# Patient Record
Sex: Male | Born: 2000 | Race: Black or African American | Hispanic: No | Marital: Single | Smoking: Never smoker
Health system: Southern US, Community
[De-identification: ages and names within clinical notes are randomized; demographics above are authoritative.]

## PROBLEM LIST (undated history)

## (undated) DIAGNOSIS — E739 Lactose intolerance, unspecified: Secondary | ICD-10-CM

## (undated) DIAGNOSIS — F900 Attention-deficit hyperactivity disorder, predominantly inattentive type: Secondary | ICD-10-CM

## (undated) DIAGNOSIS — J302 Other seasonal allergic rhinitis: Secondary | ICD-10-CM

## (undated) DIAGNOSIS — K219 Gastro-esophageal reflux disease without esophagitis: Secondary | ICD-10-CM

## (undated) HISTORY — PX: WISDOM TOOTH EXTRACTION: SHX21

## (undated) HISTORY — DX: Other seasonal allergic rhinitis: J30.2

## (undated) HISTORY — PX: MYRINGOTOMY: SUR874

## (undated) HISTORY — DX: Gastro-esophageal reflux disease without esophagitis: K21.9

## (undated) HISTORY — DX: Attention-deficit hyperactivity disorder, predominantly inattentive type: F90.0

## (undated) HISTORY — PX: OTHER SURGICAL HISTORY: SHX169

## (undated) HISTORY — DX: Lactose intolerance, unspecified: E73.9

---
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
## 2020-06-08 ENCOUNTER — Encounter: Payer: Self-pay | Admitting: Family Medicine

## 2020-06-08 ENCOUNTER — Ambulatory Visit: Payer: 59 | Admitting: Family Medicine

## 2020-06-08 ENCOUNTER — Other Ambulatory Visit: Payer: Self-pay

## 2020-06-08 ENCOUNTER — Ambulatory Visit (HOSPITAL_BASED_OUTPATIENT_CLINIC_OR_DEPARTMENT_OTHER)
Admission: RE | Admit: 2020-06-08 | Discharge: 2020-06-08 | Disposition: A | Discharge status: Home / Self Care | Payer: 59 | Source: Ambulatory Visit | Attending: Family Medicine | Admitting: Family Medicine

## 2020-06-08 VITALS — BP 110/68 | HR 69 | Temp 97.0°F | Wt 147.6 lb

## 2020-06-08 DIAGNOSIS — N5089 Other specified disorders of the male genital organs: Secondary | ICD-10-CM | POA: Diagnosis not present

## 2020-06-08 DIAGNOSIS — N50811 Right testicular pain: Secondary | ICD-10-CM

## 2020-06-08 LAB — POCT URINALYSIS DIP (PROADVANTAGE DEVICE)
Bilirubin, UA: NEGATIVE
Blood, UA: NEGATIVE
Glucose, UA: NEGATIVE mg/dL
Ketones, POC UA: NEGATIVE mg/dL
Leukocytes, UA: NEGATIVE
Nitrite, UA: NEGATIVE
Protein Ur, POC: NEGATIVE mg/dL
Specific Gravity, Urine: 1.025
Urobilinogen, Ur: NEGATIVE
pH, UA: 6 (ref 5.0–8.0)

## 2020-06-08 NOTE — Progress Notes (Signed)
   Subjective:    Patient ID: Frank Lambert, male    DOB: Dec 22, 2000, 20 y.o.   MRN: [id number]  HPI Chief Complaint  Patient presents with  . concern with testicle    Concern with right testicle, uncomfortable, feeling heavy or swelling. Noticied the last 3 days, Had an issue when younger.   Complains of right testicle pain for the past 3 days. Feels like a heaviness. States it looks different than usual, larger and not in same space as usual.  Denies hx of testicle issues with right testicle but did have a varicocele in left in 2014.   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 Does not recall a specific injury in the past week.   Last sexual encounter 2 weeks ago. Same sexual partner for 5 years.   States he masturbated and was able to ejaculate without pain or issues.   Denies fever, chills, abdominal pain, back pain, N/V/D, urinary symptoms or penile discharge.   Reviewed allergies, medications, past medical, surgical, family, and social history.    Review of Systems Pertinent positives and negatives in the history of present illness.     Objective:   Physical Exam Exam conducted with a chaperone present.  Constitutional:      General: He is not in acute distress.    Appearance: Normal appearance. He is not ill-appearing.  Pulmonary:     Effort: Pulmonary effort is normal.  Abdominal:     General: Abdomen is flat. There is no distension.     Palpations: Abdomen is soft.     Tenderness: There is no abdominal tenderness.  Genitourinary:    Penis: Normal. No tenderness, discharge or swelling.      Testes: Cremasteric reflex is present.        Right: Swelling present. Mass, tenderness, testicular hydrocele or varicocele not present.        Left: Mass, tenderness, swelling, testicular hydrocele or varicocele not present.     Epididymis:     Right: Normal.     Left: Normal.  Musculoskeletal:     Cervical back: Normal range of motion.  Skin:     General: Skin is warm and dry.  Neurological:     Mental Status: He is alert.  Psychiatric:        Mood and Affect: Mood normal.        Thought Content: Thought content normal.    BP 110/68   Pulse 69   Temp (!) 97 F (36.1 C)   Wt 147 lb 9.6 oz (67 kg)   BMI 25.34 kg/m       Assessment & Plan:  Pain in right testicle - Plan: POCT Urinalysis DIP (Proadvantage Device), GC/Chlamydia Probe Amp, Trichomonas vaginalis, RNA, [place] Art/Ven Flow Abd Pelv Doppler, US SCROTUM W/DOPPLER, CANCELED: US Scrotum  Swelling of right testicle - Plan: POCT Urinalysis DIP (Proadvantage Device), GC/Chlamydia Probe Amp, Trichomonas vaginalis, RNA, [place] Art/Ven Flow Abd Pelv Doppler, US SCROTUM W/DOPPLER, CANCELED: US Scrotum  UA negative  No acute distress. Gait is abnormal due to pain.  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 No obvious torsion, mass or red flag symptoms. I will screen for STDs and send him for stat US including doppler to assess blood flow.  Follow up pending results or if he is not seeing improvement in the next few days.

## 2020-06-08 NOTE — Progress Notes (Signed)
Spoke with patient via telephone. Will need to send him his urine (STD) results when they are back.

## 2020-06-11 LAB — GC/CHLAMYDIA PROBE AMP
Chlamydia trachomatis, NAA: NEGATIVE
Neisseria Gonorrhoeae by PCR: NEGATIVE

## 2020-06-11 LAB — TRICHOMONAS VAGINALIS, PROBE AMP: Trich vag by NAA: NEGATIVE

## 2021-10-19 ENCOUNTER — Ambulatory Visit: Payer: 59 | Admitting: Medical

## 2021-10-19 VITALS — BP 120/70 | HR 75 | Temp 97.8°F | Wt 152.8 lb

## 2021-10-19 DIAGNOSIS — K219 Gastro-esophageal reflux disease without esophagitis: Secondary | ICD-10-CM | POA: Insufficient documentation

## 2021-10-19 DIAGNOSIS — R195 Other fecal abnormalities: Secondary | ICD-10-CM | POA: Diagnosis not present

## 2021-10-19 DIAGNOSIS — R142 Eructation: Secondary | ICD-10-CM | POA: Insufficient documentation

## 2021-10-19 DIAGNOSIS — R194 Change in bowel habit: Secondary | ICD-10-CM | POA: Diagnosis not present

## 2021-10-19 NOTE — Progress Notes (Signed)
Subjective:  Frank Lambert is a 21 y.o. male who presents for Chief Complaint  Patient presents with   Heartburn    Heartburn, reflux and GI issues- going on since High school. Dairy,spicy foods will cause diarrhea or stomach pain. Declines flu shot      Here for concerns of IBS, heartburn.  Here with mother today  Doesn't do well with dairy, spicy foods.  Yogurt is ok though.  Gets bloaty, loud burps, digestion issues.  Been having problems for a while.  Parents have bad GERD issues as well.   If eating ice cream, uses a lactaid supplement. Cant handle spicy foods.    Currently using some tums, sometimes imodium, sometimes pepto bismol.  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    Has several BMs daily, frequent BMs, loose.  No blood in stool.   No constipation.    Caffeine worsened the loose stools  Denies alcohol or tobacco use  He tries to use diet caution, but does eat a lot of tomatoes.  No other aggravating or relieving factors.    No other c/o.  Past Medical History:  Diagnosis Date   ADHD (attention deficit hyperactivity disorder), inattentive type    per [facility] records   Lactose intolerance    Seasonal allergies    Current Outpatient Medications on File Prior to Visit  Medication Sig Dispense Refill   cholecalciferol (VITAMIN D) 1000 units tablet Take 1,000 Units by mouth daily.     No current facility-administered medications on file prior to visit.   The following portions of the patient's history were reviewed and updated as appropriate: allergies, current medications, past family history, past medical history, past social history, past surgical history and problem list.  ROS Otherwise as in subjective above  Objective: BP 120/70   Pulse 75   Temp 97.8 F (36.6 C)   Wt 152 lb 12.8 oz (69.3 kg)   BMI 26.23 kg/m   General appearance: alert, no distress, well developed, well nourished Oral cavity: MMM, no lesions Abdomen: +bs, soft, non tender, non distended, no masses,  no hepatomegaly, no splenomegaly Pulses: 2+ radial pulses, 2+ pedal pulses, normal cap refill Ext: no edema   Assessment: Encounter Diagnoses  Name Primary?   Loose stools Yes   Frequent bowel movements    Belching    Gastroesophageal reflux disease, unspecified whether esophagitis present      Plan: Counseled on diet, food diary, avoiding food triggers, food discretion, stress reduction where possible.  Handouts given.    Patient Instructions  Keep the following medications on hand for as needed use:  Pepto bismol for upset stomach, stomach cramping or pain or nausea Imodium over the counter up to twice daily for cramping and loose stools Pepcid /Famotidine OTC once daily for acid reflux flare ups Tums as needed for acid reflux/indigestion Consider taking a daily Probiotic such as IB Gard or Align for these concerns  Monitor food diary to avoid foods that make things worse  Avoid eating fast or close to bedtime   If symptoms get much worse, lets refer to gastro specialist   Gastroesophageal Reflux Disease, Adult  Gastroesophageal reflux (GER) happens when acid from the stomach flows up into the tube that connects the mouth and the stomach (esophagus). Normally, food travels down the esophagus and stays in the stomach to be digested. With GER, food and stomach acid sometimes move back up into the esophagus. You may have a disease called gastroesophageal reflux disease (GERD) if the reflux:  Happens often. Causes frequent or very bad symptoms. Causes problems such as damage to the esophagus. When this happens, the esophagus becomes sore and swollen. Over time, GERD can make small holes (ulcers) in the lining of the esophagus. What are the causes? This condition is caused by a problem with the muscle between the esophagus and the stomach. When this muscle is weak or not normal, it does not close properly to keep food and acid from coming back up from the stomach. The  muscle can be weak because of: Tobacco use. Pregnancy. Having a certain type of hernia (hiatal hernia). Alcohol use. Certain foods and drinks, such as coffee, chocolate, onions, and peppermint. What increases the risk? Being overweight. Having a disease that affects your connective tissue. Taking NSAIDs, such a ibuprofen. What are the signs or symptoms? Heartburn. Difficult or painful swallowing. The feeling of having a lump in the throat. A bitter taste in the mouth. Bad breath. Having a lot of saliva. Having an upset or bloated stomach. Burping. Chest pain. Different conditions can cause chest pain. Make sure you see your doctor if you have chest pain. Shortness of breath or wheezing. A long-term cough or a cough at night. Wearing away of the surface of teeth (tooth enamel). Weight loss. How is this treated? Making changes to your diet. Taking medicine. Having surgery. Treatment will depend on how bad your symptoms are. Follow these instructions at home: Eating and drinking  Follow a diet as told by your doctor. You may need to avoid foods and drinks such as: Coffee and tea, with or without caffeine. Drinks that contain alcohol. Energy drinks and sports drinks. Bubbly (carbonated) drinks or sodas. Chocolate and cocoa. Peppermint and mint flavorings. Garlic and onions. Horseradish. Spicy and acidic foods. These include peppers, chili powder, curry powder, vinegar, hot sauces, and BBQ sauce. Citrus fruit juices and citrus fruits, such as oranges, lemons, and limes. Tomato-based foods. These include red sauce, chili, salsa, and pizza with red sauce. Fried and fatty foods. These include donuts, french fries, potato chips, and high-fat dressings. High-fat meats. These include hot dogs, rib eye steak, sausage, ham, and bacon. High-fat dairy items, such as whole milk, butter, and cream cheese. Eat small meals often. Avoid eating large meals. Avoid drinking large amounts of  liquid with your meals. Avoid eating meals during the 2-3 hours before bedtime. Avoid lying down right after you eat. Do not exercise right after you eat. Lifestyle  Do not smoke or use any products that contain nicotine or tobacco. If you need help quitting, ask your doctor. Try to lower your stress. If you need help doing this, ask your doctor. If you are overweight, lose an amount of weight that is healthy for you. Ask your doctor about a safe weight loss goal. General instructions Pay attention to any changes in your symptoms. Take over-the-counter and prescription medicines only as told by your doctor. Do not take aspirin, ibuprofen, or other NSAIDs unless your doctor says it is okay. Wear loose clothes. Do not wear anything tight around your waist. Raise (elevate) the head of your bed about 6 inches (15 cm). You may need to use a wedge to do this. Avoid bending over if this makes your symptoms worse. Keep all follow-up visits. Contact a doctor if: You have new symptoms. You lose weight and you do not know why. You have trouble swallowing or it hurts to swallow. You have wheezing or a cough that keeps happening. You have a hoarse  voice. Your symptoms do not get better with treatment. Get help right away if: You have sudden pain in your arms, neck, jaw, teeth, or back. You suddenly feel sweaty, dizzy, or light-headed. You have chest pain or shortness of breath. You vomit and the vomit is green, yellow, or black, or it looks like blood or coffee grounds. You faint. Your poop (stool) is red, bloody, or black. You cannot swallow, drink, or eat. These symptoms may represent a serious problem that is an emergency. Do not wait to see if the symptoms will go away. Get medical help right away. Call your local emergency services (911 in the [place] Do not drive yourself to the hospital. Summary If a person has gastroesophageal reflux disease (GERD), food and stomach acid move back up into  the esophagus and cause symptoms or problems such as damage to the esophagus. Treatment will depend on how bad your symptoms are. Follow a diet as told by your doctor. Take all medicines only as told by your doctor. This information is not intended to replace advice given to you by your health care provider. Make sure you discuss any questions you have with your health care provider. Document Revised: 07/26/2019 Document Reviewed: 07/26/2019 Elsevier Patient Education  [place].     Irritable Bowel Syndrome, Adult  Irritable bowel syndrome (IBS) is a group of symptoms that affects the organs responsible for digestion (gastrointestinal tract, or GI tract). IBS is not one specific disease. To regulate how the GI tract works, the body sends signals back and forth between the intestines and the brain. If you have IBS, there may be a problem with these signals. As a result, the GI tract does not function normally. The intestines may become more sensitive and overreact to certain things. This may be especially true when you eat certain foods or when you are under stress. There are four main types of IBS. These may be determined based on the consistency of your stool (feces): IBS with mostly (predominance of) diarrhea. IBS with predominance of constipation. IBS with mixed bowel habits. This includes both diarrhea and constipation. IBS unclassified. This includes IBS that cannot be categorized into one of the other three main types. It is important to know which type of IBS you have. Certain treatments are more likely to be helpful for certain types of IBS. What are the causes? The exact cause of IBS is not known. What increases the risk? You may have a higher risk for IBS if you: Are male. Are younger than 40 years. Have a family history of IBS. Have a mental health condition, such as depression, anxiety, or post-traumatic stress disorder. Have had a bacterial infection of your GI  tract. What are the signs or symptoms? Symptoms of IBS vary from person to person. The main symptom is abdominal pain or discomfort. Other symptoms usually include one or more of the following: Diarrhea, constipation, or both. Swelling or bloating in the abdomen. Feeling full after eating a small or regular-sized meal. Frequent gas. Mucus in the stool. A feeling of having more stool left after a bowel movement. Symptoms tend to come and go. They may be triggered by stress, mental health conditions, or certain foods. How is this diagnosed? This condition may be diagnosed based on a physical exam, your medical history, and your symptoms. You may have tests, such as: Blood tests. Stool test. Colonoscopy. This is a procedure in which your GI tract is viewed with a long, thin, flexible tube.  How is this treated? There is no cure for IBS, but treatment can help relieve symptoms. Treatment depends on the type of IBS you have, and may include: Changes to your diet, such as: Avoiding foods that cause symptoms. Drinking more water. Following a low-FODMAP (fermentable oligosaccharides, disaccharides, monosaccharides, and polyols) diet for up to 6 weeks, or as told by your health care provider. FODMAPs are sugars that are hard for some people to digest. Eating more fiber. Eating small meals at the same times every day. Medicines. These may include: Fiber supplements, if you have constipation. Medicine to control diarrhea (antidiarrheal medicines). Medicine to help control muscle tightening (spasms) in your GI tract (antispasmodic medicines). Medicines to help with mental health conditions, such as antidepressants. Talk therapy or counseling. Working with a dietitian to help create a food plan that is right for you. Managing your stress. Follow these instructions at home: Eating and drinking  Eat a healthy diet. Eat 5-6 small meals a day. Try to eat meals at about the same times each day. Do  not eat large meals. Gradually eat more fiber-rich foods. These include whole grains, fruits, and vegetables. This may be especially helpful if you have IBS with constipation. Eat a diet low in FODMAPs. You may need to avoid foods such as citrus fruits, cabbage, garlic, and onions. Drink enough fluid to keep your urine pale yellow. Keep a journal of foods that seem to trigger symptoms. Avoid foods and drinks that: Contain added sugar. Make your symptoms worse. These may include dairy products, caffeinated drinks, and carbonated drinks. Alcohol use Do not drink alcohol if: Your health care provider tells you not to drink. You are pregnant, may be pregnant, or are planning to become pregnant. If you drink alcohol: Limit how much you have to: 0-1 drink a day for women. 0-2 drinks a day for men. Know how much alcohol is in your drink. In the [place] one drink equals one 12 oz bottle of beer (355 mL), one 5 oz glass of wine (148 mL), or one 1 oz glass of hard liquor (44 mL) General instructions Take over-the-counter and prescription medicines only as told by your health care provider. This includes supplements. Get enough exercise. Do at least 150 minutes of moderate-intensity exercise each week. Manage your stress. Getting enough sleep and exercise can help you manage stress. Keep all follow-up visits. This is important. This includes all visits with your health care provider and therapist. Where to find more information International [employer] for Functional Gastrointestinal Disorders: aboutibs.[employer] of Diabetes and Digestive and Kidney Diseases: [employer] Contact a health care provider if: You have constant pain. You lose weight. You have diarrhea that gets worse. You have bleeding from the rectum. You vomit often. You have a fever. Get help right away if: You have severe abdominal pain. You have diarrhea with symptoms of dehydration, such as dizziness or dry  mouth. You have bloody or black stools. You have severe abdominal bloating. You have vomiting that does not stop. You have blood in your vomit. Summary Irritable bowel syndrome (IBS) is not one specific disease. It is a group of symptoms that affects digestion. Your intestines may become more sensitive and overreact to certain things. This may be especially true when you eat certain foods or when you are under stress. There is no cure for IBS, but treatment can help relieve symptoms. This information is not intended to replace advice given to you by your health care provider. Make sure you  discuss any questions you have with your health care provider. Document Revised: 12/27/2020 Document Reviewed: 12/27/2020 Elsevier Patient Education  2023 [place] was seen today for heartburn.  Diagnoses and all orders for this visit:  Loose stools  Frequent bowel movements  Belching  Gastroesophageal reflux disease, unspecified whether esophagitis present    Follow up: prn

## 2021-10-19 NOTE — Patient Instructions (Signed)
Keep the following medications on hand for as needed use:  Pepto bismol for upset stomach, stomach cramping or pain or nausea Imodium over the counter up to twice daily for cramping and loose stools Pepcid /Famotidine OTC once daily for acid reflux flare ups Tums as needed for acid reflux/indigestion Consider taking a daily Probiotic such as IB Gard or Align for these concerns  Monitor food diary to avoid foods that make things worse  Avoid eating fast or close to bedtime   If symptoms get much worse, lets refer to gastro specialist   Gastroesophageal Reflux Disease, Adult  Gastroesophageal reflux (GER) happens when acid from the stomach flows up into the tube that connects the mouth and the stomach (esophagus). Normally, food travels down the esophagus and stays in the stomach to be digested. With GER, food and stomach acid sometimes move back up into the esophagus. You may have a disease called gastroesophageal reflux disease (GERD) if the reflux: Happens often. Causes frequent or very bad symptoms. Causes problems such as damage to the esophagus. When this happens, the esophagus becomes sore and swollen. Over time, GERD can make small holes (ulcers) in the lining of the esophagus. What are the causes? This condition is caused by a problem with the muscle between the esophagus and the stomach. When this muscle is weak or not normal, it does not close properly to keep food and acid from coming back up from the stomach. The muscle can be weak because of: Tobacco use. Pregnancy. Having a certain type of hernia (hiatal hernia). Alcohol use. Certain foods and drinks, such as coffee, chocolate, onions, and peppermint. What increases the risk? Being overweight. Having a disease that affects your connective tissue. Taking NSAIDs, such a ibuprofen. What are the signs or symptoms? Heartburn. Difficult or painful swallowing. The feeling of having a lump in the throat. A bitter taste in  the mouth. Bad breath. Having a lot of saliva. Having an upset or bloated stomach. Burping. Chest pain. Different conditions can cause chest pain. Make sure you see your doctor if you have chest pain. Shortness of breath or wheezing. A long-term cough or a cough at night. Wearing away of the surface of teeth (tooth enamel). Weight loss. How is this treated? Making changes to your diet. Taking medicine. Having surgery. Treatment will depend on how bad your symptoms are. Follow these instructions at home: Eating and drinking  Follow a diet as told by your doctor. You may need to avoid foods and drinks such as: Coffee and tea, with or without caffeine. Drinks that contain alcohol. Energy drinks and sports drinks. Bubbly (carbonated) drinks or sodas. Chocolate and cocoa. Peppermint and mint flavorings. Garlic and onions. Horseradish. Spicy and acidic foods. These include peppers, chili powder, curry powder, vinegar, hot sauces, and BBQ sauce. Citrus fruit juices and citrus fruits, such as oranges, lemons, and limes. Tomato-based foods. These include red sauce, chili, salsa, and pizza with red sauce. Fried and fatty foods. These include donuts, french fries, potato chips, and high-fat dressings. High-fat meats. These include hot dogs, rib eye steak, sausage, ham, and bacon. High-fat dairy items, such as whole milk, butter, and cream cheese. Eat small meals often. Avoid eating large meals. Avoid drinking large amounts of liquid with your meals. Avoid eating meals during the 2-3 hours before bedtime. Avoid lying down right after you eat. Do not exercise right after you eat. Lifestyle  Do not smoke or use any products that contain nicotine or tobacco. If you  need help quitting, ask your doctor. Try to lower your stress. If you need help doing this, ask your doctor. If you are overweight, lose an amount of weight that is healthy for you. Ask your doctor about a safe weight loss  goal. General instructions Pay attention to any changes in your symptoms. Take over-the-counter and prescription medicines only as told by your doctor. Do not take aspirin, ibuprofen, or other NSAIDs unless your doctor says it is okay. Wear loose clothes. Do not wear anything tight around your waist. Raise (elevate) the head of your bed about 6 inches (15 cm). You may need to use a wedge to do this. Avoid bending over if this makes your symptoms worse. Keep all follow-up visits. Contact a doctor if: You have new symptoms. You lose weight and you do not know why. You have trouble swallowing or it hurts to swallow. You have wheezing or a cough that keeps happening. You have a hoarse voice. Your symptoms do not get better with treatment. Get help right away if: You have sudden pain in your arms, neck, jaw, teeth, or back. You suddenly feel sweaty, dizzy, or light-headed. You have chest pain or shortness of breath. You vomit and the vomit is green, yellow, or black, or it looks like blood or coffee grounds. You faint. Your poop (stool) is red, bloody, or black. You cannot swallow, drink, or eat. These symptoms may represent a serious problem that is an emergency. Do not wait to see if the symptoms will go away. Get medical help right away. Call your local emergency services (911 in the [place] Do not drive yourself to the hospital. Summary If a person has gastroesophageal reflux disease (GERD), food and stomach acid move back up into the esophagus and cause symptoms or problems such as damage to the esophagus. Treatment will depend on how bad your symptoms are. Follow a diet as told by your doctor. Take all medicines only as told by your doctor. This information is not intended to replace advice given to you by your health care provider. Make sure you discuss any questions you have with your health care provider. Document Revised: 07/26/2019 Document Reviewed: 07/26/2019 Elsevier Patient  Education  [place].     Irritable Bowel Syndrome, Adult  Irritable bowel syndrome (IBS) is a group of symptoms that affects the organs responsible for digestion (gastrointestinal tract, or GI tract). IBS is not one specific disease. To regulate how the GI tract works, the body sends signals back and forth between the intestines and the brain. If you have IBS, there may be a problem with these signals. As a result, the GI tract does not function normally. The intestines may become more sensitive and overreact to certain things. This may be especially true when you eat certain foods or when you are under stress. There are four main types of IBS. These may be determined based on the consistency of your stool (feces): IBS with mostly (predominance of) diarrhea. IBS with predominance of constipation. IBS with mixed bowel habits. This includes both diarrhea and constipation. IBS unclassified. This includes IBS that cannot be categorized into one of the other three main types. It is important to know which type of IBS you have. Certain treatments are more likely to be helpful for certain types of IBS. What are the causes? The exact cause of IBS is not known. What increases the risk? You may have a higher risk for IBS if you: Are male. Are younger than  40 years. Have a family history of IBS. Have a mental health condition, such as depression, anxiety, or post-traumatic stress disorder. Have had a bacterial infection of your GI tract. What are the signs or symptoms? Symptoms of IBS vary from person to person. The main symptom is abdominal pain or discomfort. Other symptoms usually include one or more of the following: Diarrhea, constipation, or both. Swelling or bloating in the abdomen. Feeling full after eating a small or regular-sized meal. Frequent gas. Mucus in the stool. A feeling of having more stool left after a bowel movement. Symptoms tend to come and go. They may be  triggered by stress, mental health conditions, or certain foods. How is this diagnosed? This condition may be diagnosed based on a physical exam, your medical history, and your symptoms. You may have tests, such as: Blood tests. Stool test. Colonoscopy. This is a procedure in which your GI tract is viewed with a long, thin, flexible tube. How is this treated? There is no cure for IBS, but treatment can help relieve symptoms. Treatment depends on the type of IBS you have, and may include: Changes to your diet, such as: Avoiding foods that cause symptoms. Drinking more water. Following a low-FODMAP (fermentable oligosaccharides, disaccharides, monosaccharides, and polyols) diet for up to 6 weeks, or as told by your health care provider. FODMAPs are sugars that are hard for some people to digest. Eating more fiber. Eating small meals at the same times every day. Medicines. These may include: Fiber supplements, if you have constipation. Medicine to control diarrhea (antidiarrheal medicines). Medicine to help control muscle tightening (spasms) in your GI tract (antispasmodic medicines). Medicines to help with mental health conditions, such as antidepressants. Talk therapy or counseling. Working with a dietitian to help create a food plan that is right for you. Managing your stress. Follow these instructions at home: Eating and drinking  Eat a healthy diet. Eat 5-6 small meals a day. Try to eat meals at about the same times each day. Do not eat large meals. Gradually eat more fiber-rich foods. These include whole grains, fruits, and vegetables. This may be especially helpful if you have IBS with constipation. Eat a diet low in FODMAPs. You may need to avoid foods such as citrus fruits, cabbage, garlic, and onions. Drink enough fluid to keep your urine pale yellow. Keep a journal of foods that seem to trigger symptoms. Avoid foods and drinks that: Contain added sugar. Make your symptoms  worse. These may include dairy products, caffeinated drinks, and carbonated drinks. Alcohol use Do not drink alcohol if: Your health care provider tells you not to drink. You are pregnant, may be pregnant, or are planning to become pregnant. If you drink alcohol: Limit how much you have to: 0-1 drink a day for women. 0-2 drinks a day for men. Know how much alcohol is in your drink. In the [place] one drink equals one 12 oz bottle of beer (355 mL), one 5 oz glass of wine (148 mL), or one 1 oz glass of hard liquor (44 mL) General instructions Take over-the-counter and prescription medicines only as told by your health care provider. This includes supplements. Get enough exercise. Do at least 150 minutes of moderate-intensity exercise each week. Manage your stress. Getting enough sleep and exercise can help you manage stress. Keep all follow-up visits. This is important. This includes all visits with your health care provider and therapist. Where to find more information [employer] for Functional Gastrointestinal Disorders: aboutibs[employer]  of Diabetes and Digestive and Kidney Diseases: [employer] Contact a health care provider if: You have constant pain. You lose weight. You have diarrhea that gets worse. You have bleeding from the rectum. You vomit often. You have a fever. Get help right away if: You have severe abdominal pain. You have diarrhea with symptoms of dehydration, such as dizziness or dry mouth. You have bloody or black stools. You have severe abdominal bloating. You have vomiting that does not stop. You have blood in your vomit. Summary Irritable bowel syndrome (IBS) is not one specific disease. It is a group of symptoms that affects digestion. Your intestines may become more sensitive and overreact to certain things. This may be especially true when you eat certain foods or when you are under stress. There is no cure for IBS, but treatment  can help relieve symptoms. This information is not intended to replace advice given to you by your health care provider. Make sure you discuss any questions you have with your health care provider. Document Revised: 12/27/2020 Document Reviewed: 12/27/2020 Elsevier Patient Education  2023 [employer].

## 2021-10-27 IMAGING — US US SCROTUM W/ DOPPLER COMPLETE
1 series · 14 of 25 positions shown · non-contrast
Comparison: None.

CLINICAL DATA: Right testicular discomfort.

EXAM:
SCROTAL ULTRASOUND
DOPPLER ULTRASOUND OF THE TESTICLES
TECHNIQUE: Complete ultrasound examination of the testicles, epididymis, and
other scrotal structures was performed. Color and spectral Doppler
ultrasound were also utilized to evaluate blood flow to the
testicles.

[Series 1: us scrotum w/doppler · 14 of 47 slices shown]
[im 1/47]
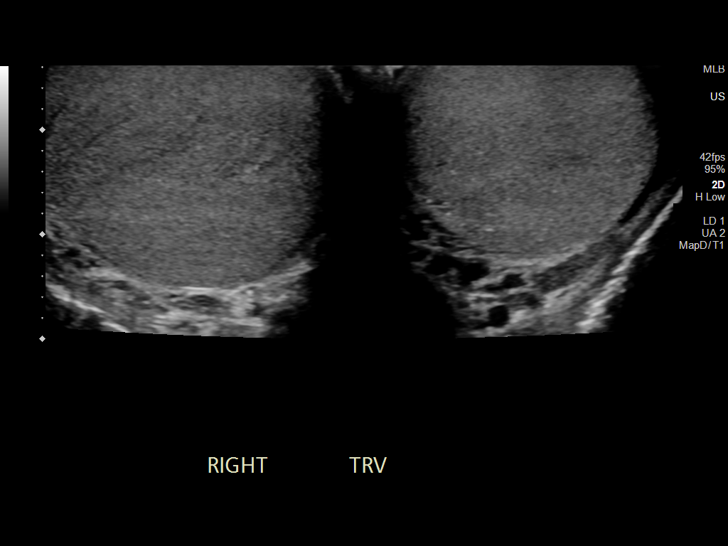
[im 4/47]
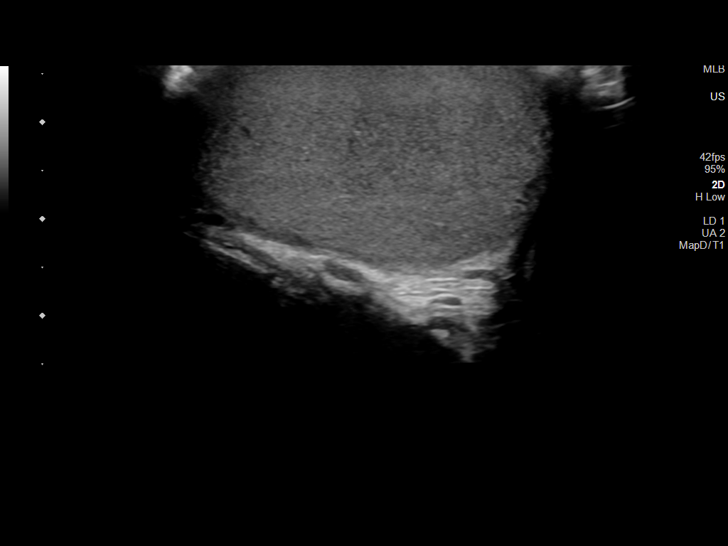
[im 8/47]
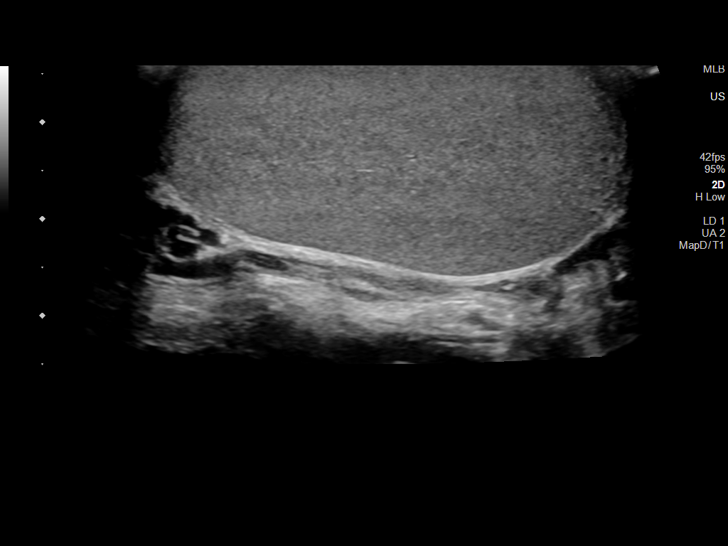
[im 12/47]
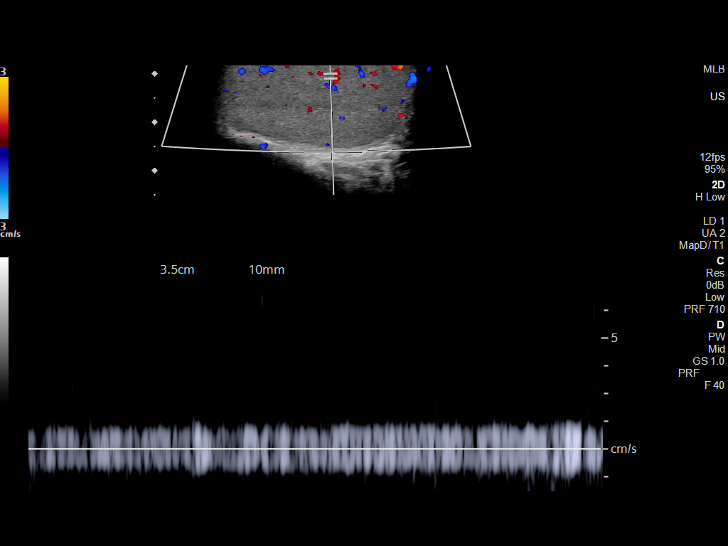
[im 16/47]
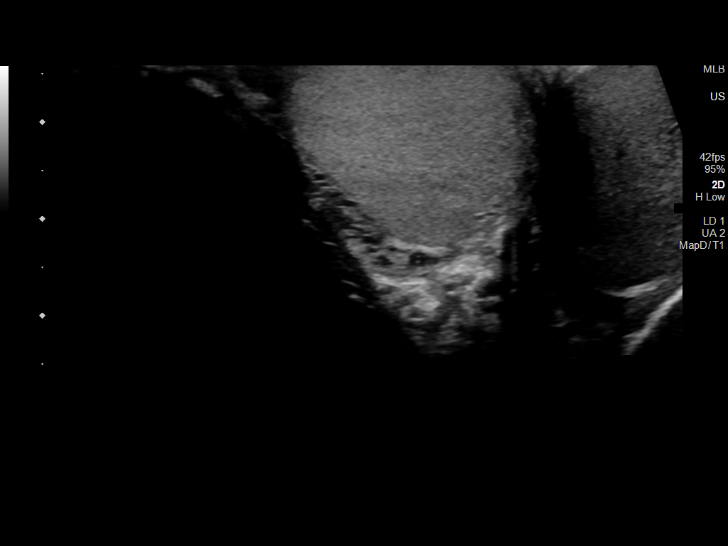
[im 18/47]
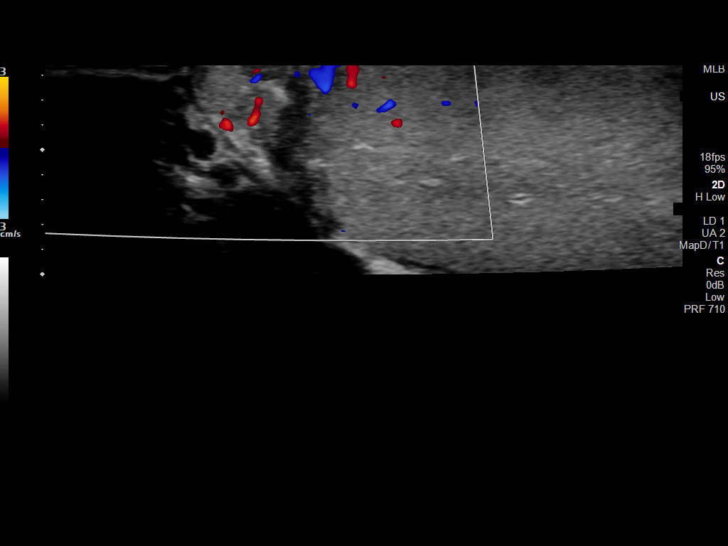
[im 22/47]
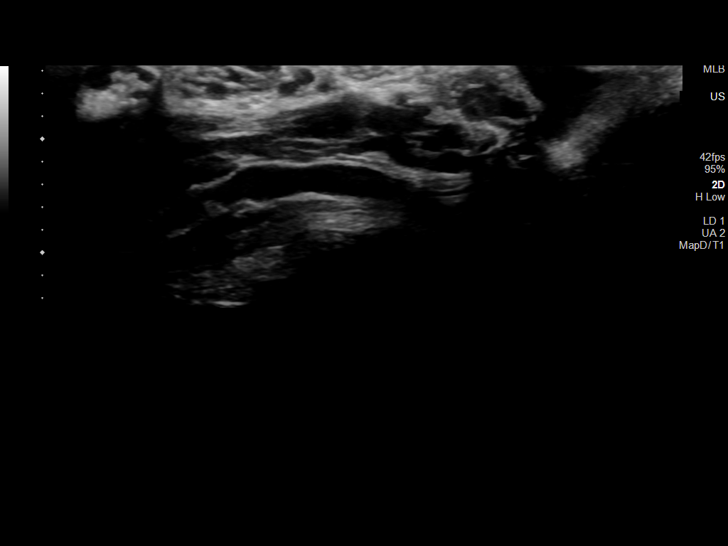
[im 25/47]
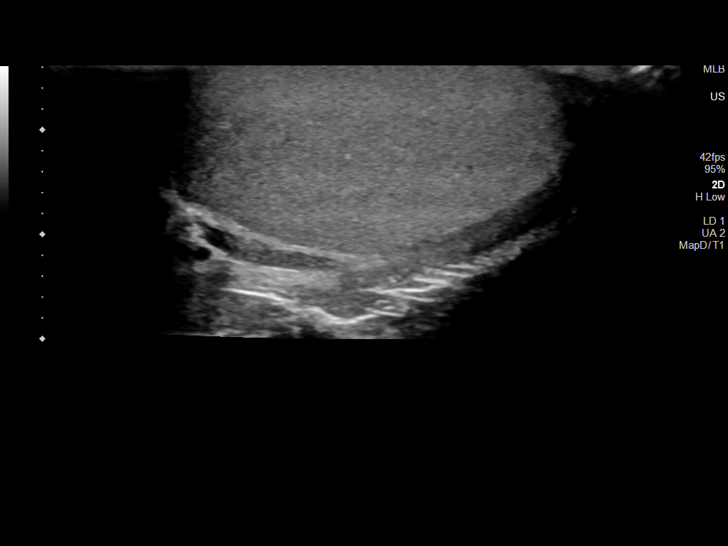
[im 29/47]
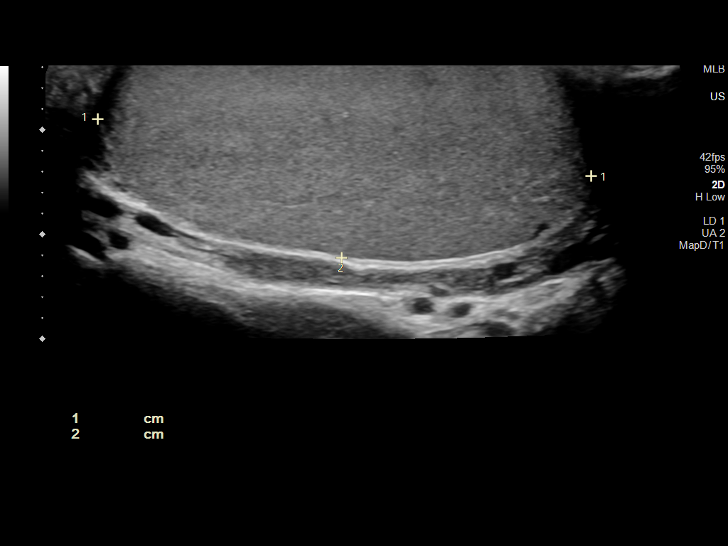
[im 31/47]
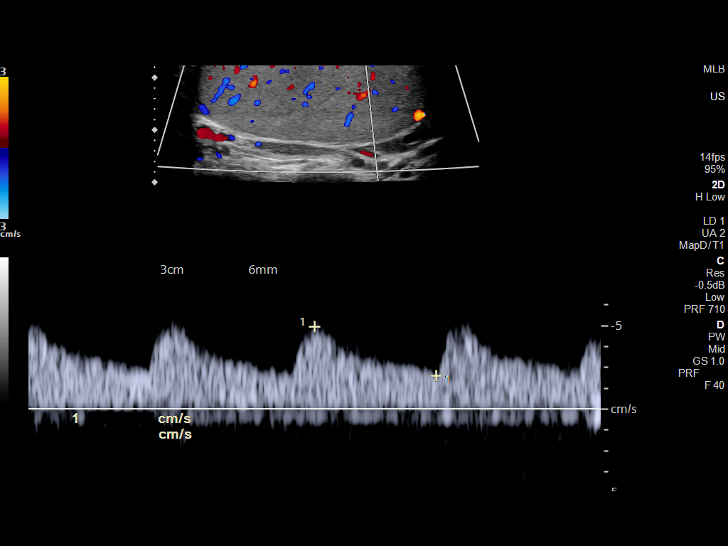
[im 35/47]
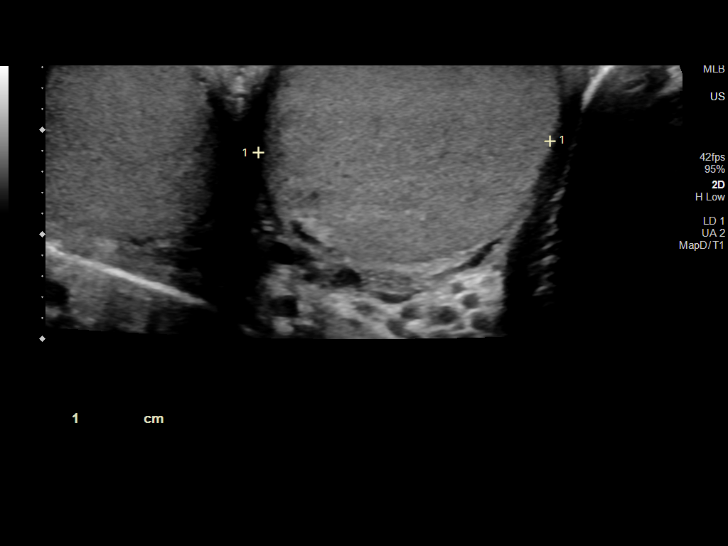
[im 39/47]
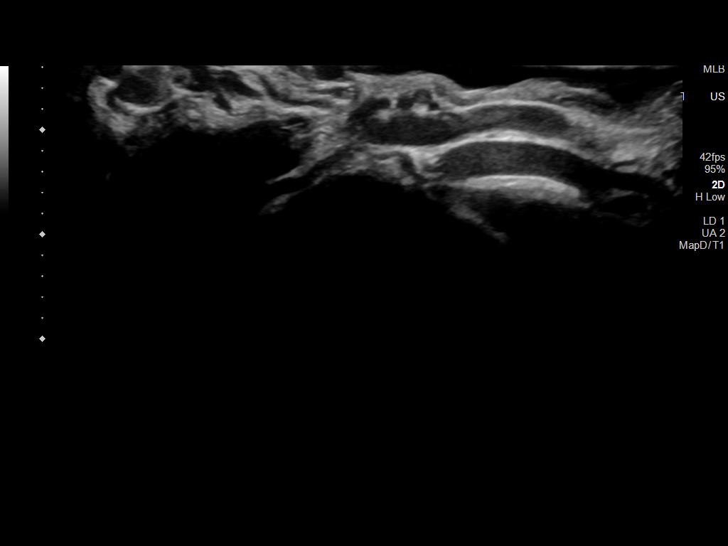
[im 43/47]
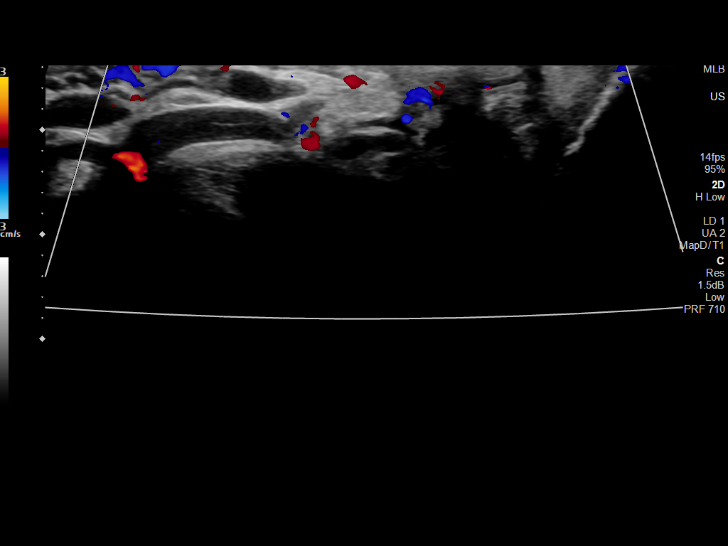
[im 47/47]
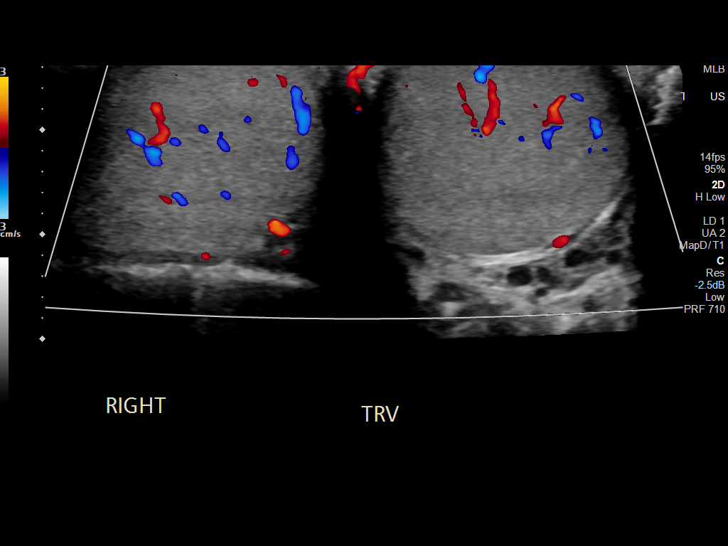

[14 of 25 positions shown; findings below may reference images not displayed]

FINDINGS: Right testicle

Measurements: 4.9 x 2.3 x 3.2 cm. No mass or microlithiasis
visualized.

Left testicle

Measurements: 4.8 x 2.0 x 2.8 cm. No mass or microlithiasis
visualized.

Right epididymis:  Normal in size and appearance.

Left epididymis:  Normal in size and appearance.

Hydrocele:  None visualized.

Varicocele:  Bilateral varicoceles identified.

Pulsed Doppler interrogation of both testes demonstrates normal low
resistance arterial and venous waveforms bilaterally.
IMPRESSION: 1. Bilateral varicoceles.
2. No other abnormalities.

## 2021-12-12 ENCOUNTER — Encounter: Payer: Self-pay | Admitting: Internal Medicine

## 2022-06-26 ENCOUNTER — Ambulatory Visit: Payer: 59 | Admitting: Medical

## 2022-06-26 VITALS — BP 110/70 | HR 82 | Temp 97.0°F | Wt 144.0 lb

## 2022-06-26 DIAGNOSIS — K30 Functional dyspepsia: Secondary | ICD-10-CM

## 2022-06-26 DIAGNOSIS — Z789 Other specified health status: Secondary | ICD-10-CM

## 2022-06-26 DIAGNOSIS — R195 Other fecal abnormalities: Secondary | ICD-10-CM | POA: Diagnosis not present

## 2022-06-26 NOTE — Progress Notes (Signed)
Subjective:  Frank Lambert is a 22 y.o. male who presents for Chief Complaint  Patient presents with   digestion    Digestion- Frank Lambert got bit by mosiqutios, took benadryl, allergic to mosiquitos and will swell up. Had loose stools, feeling off- fever, body aches trip was may 12-16. Took imodium that weekend afterwards, weak, nausated, traveler's diarrhea, some trouble breathing, fever, acid reflux on may 20th. Still having acid reflux. Dengue fever- lost of family members were diagnosed with this from mosquito bites.     Here with mother.  Here for digestion issues, loose stools.     He recently traveled to [place] May 12-16, had numerous mosquito bites while on this trip.   After coming back home started having some loose stools, chills, not feeling well, possible fever.   Used some imodium.  Was having chest pain, abdominal pain, lightheaded.   EMS was called, they could find any significant thing to address, they felt it could be indigestion.   He was not taken to the hospital.  Did have some dry heaving that day EMS was called, as he was trying to make himself vomiting.   That seemed to make tings worse.   He ended up using some Pepcid for about 4 days.  The next day, found out several older people in their group had gotten dengue fever, some ended up in the hospital.  No particular symptoms in the past week.     No other aggravating or relieving factors.    No other c/o.  Past Medical History:  Diagnosis Date   ADHD (attention deficit hyperactivity disorder), inattentive type    per [facility] records   Lactose intolerance    Seasonal allergies    Current Outpatient Medications on File Prior to Visit  Medication Sig Dispense Refill   cholecalciferol (VITAMIN D) 1000 units tablet Take 1,000 Units by mouth daily.     No current facility-administered medications on file prior to visit.    The following portions of the patient's history were reviewed and updated as appropriate:  allergies, current medications, past family history, past medical history, past social history, past surgical history and problem list.  ROS Otherwise as in subjective above  Objective: BP 110/70   Pulse 82   Temp (!) 97 F (36.1 C)   Wt 144 lb (65.3 kg)   BMI 24.72 kg/m    Wt Readings from Last 3 Encounters:  06/26/22 144 lb (65.3 kg)  10/19/21 152 lb 12.8 oz (69.3 kg)  06/08/20 147 lb 9.6 oz (67 kg)    General appearance: alert, no distress, well developed, well nourished Neck: supple, no lymphadenopathy, no thyromegaly, no masses Heart: RRR, normal S1, S2, no murmurs Lungs: CTA bilaterally, no wheezes, rhonchi, or rales Abdomen: +bs, soft, non tender, non distended, no masses, no hepatomegaly, no splenomegaly Pulses: 2+ radial pulses, 2+ pedal pulses, normal cap refill Ext: no edema   Assessment: Encounter Diagnoses  Name Primary?   Loose stools Yes   History of foreign travel    Indigestion      Plan: We discussed his recent symptoms and concerns.  Currently he is asymptomatic for at least a week, exam unremarkable  I suspect some of his recent indigestion and stool changes or in part due to diet.  He was in [place] eating spicy foods and did take some Pepcid which helped.  We discussed that there is the possibility he could have had some kind of viral illness or dengue fever even.  Several people in the group each other with ended up getting dengue fever but they were older and some had to be hospitalized.  He had some symptoms that could be consistent with viral or doing a fever type syndrome however he is resolved fully at this point  Recheck in a few months for a general well visit and labs  Frank Lambert was seen today for digestion.  Diagnoses and all orders for this visit:  Loose stools  History of foreign travel  Indigestion    Follow up: prn

## 2022-08-07 ENCOUNTER — Encounter: Payer: Self-pay | Admitting: Medical

## 2022-08-07 ENCOUNTER — Ambulatory Visit: Payer: 59 | Admitting: Medical

## 2022-08-07 VITALS — BP 110/80 | HR 73 | Ht 65.0 in | Wt 147.6 lb

## 2022-08-07 DIAGNOSIS — K219 Gastro-esophageal reflux disease without esophagitis: Secondary | ICD-10-CM

## 2022-08-07 DIAGNOSIS — Z13220 Encounter for screening for lipoid disorders: Secondary | ICD-10-CM

## 2022-08-07 DIAGNOSIS — Z0000 Encounter for general adult medical examination without abnormal findings: Secondary | ICD-10-CM

## 2022-08-07 DIAGNOSIS — Z23 Encounter for immunization: Secondary | ICD-10-CM

## 2022-08-07 DIAGNOSIS — Z113 Encounter for screening for infections with a predominantly sexual mode of transmission: Secondary | ICD-10-CM

## 2022-08-07 LAB — LIPID PANEL

## 2022-08-07 NOTE — Progress Notes (Signed)
Subjective:   HPI  Frank Lambert is a 22 y.o. male who presents for Chief Complaint  Patient presents with   Annual Exam    Nonfasting cpe, no concerns    Patient Care Team: Steffan Caniglia, Cleda Mccreedy as PCP - General (Family Medicine) Dr. Murlean Iba, GI Mr. Jethro Bolus, eye doctor   Concerns: Has had some issues with GERD, has seen GI recently for consult.  Has PPI but hasn't been using this.   Was using a lot of cheese, but has cut back per GI.   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  Has had some issues with peers, deleted social media due to feeling overwhelmed with some attention from his weight training.   Reviewed their medical, surgical, family, social, medication, and allergy history and updated chart as appropriate.  No Known Allergies  Past Medical History:  Diagnosis Date   ADHD (attention deficit hyperactivity disorder), inattentive type    per [facility] records   GERD (gastroesophageal reflux disease)    Lactose intolerance    Seasonal allergies     Current Outpatient Medications on File Prior to Visit  Medication Sig Dispense Refill   omeprazole (PRILOSEC) 20 MG capsule Take 20 mg by mouth daily. (Patient not taking: Reported on 08/07/2022)     No current facility-administered medications on file prior to visit.     Current Outpatient Medications:    omeprazole (PRILOSEC) 20 MG capsule, Take 20 mg by mouth daily. (Patient not taking: Reported on 08/07/2022), Disp: , Rfl:   Family History  Problem Relation Age of Onset   GER disease Mother    GER disease Father    Cancer Maternal Grandmother        breast   High blood pressure Maternal Grandfather    Cancer Paternal Grandmother        pancreatic   Heart disease Neg Hx    Stroke Neg Hx     Past Surgical History:  Procedure Laterality Date   MYRINGOTOMY     tubes in ears     WISDOM TOOTH EXTRACTION      Review of Systems  Constitutional:  Negative for  chills, fever, malaise/fatigue and weight loss.  HENT:  Negative for congestion, ear pain, hearing loss, sore throat and tinnitus.   Eyes:  Negative for blurred vision, pain and redness.  Respiratory:  Negative for cough, hemoptysis and shortness of breath.   Cardiovascular:  Negative for chest pain, palpitations, orthopnea, claudication and leg swelling.  Gastrointestinal:  Negative for abdominal pain, blood in stool, constipation, diarrhea, nausea and vomiting.  Genitourinary:  Negative for dysuria, flank pain, frequency, hematuria and urgency.  Musculoskeletal:  Negative for falls, joint pain and myalgias.  Skin:  Negative for itching and rash.  Neurological:  Negative for dizziness, tingling, speech change, weakness and headaches.  Endo/Heme/Allergies:  Negative for polydipsia. Does not bruise/bleed easily.  Psychiatric/Behavioral:  Negative for depression and memory loss. The patient is not nervous/anxious and does not have insomnia.       Objective:  BP 110/80   Pulse 73   Ht 5\' 5"  (1.651 m)   Wt 147 lb 9.6 oz (67 kg)   BMI 24.56 kg/m   General appearance: alert, no distress, WD/WN, African American male Skin: unremarkable HEENT: normocephalic, conjunctiva/corneas normal, sclerae anicteric, PERRLA, EOMi, nares patent, no discharge or erythema, pharynx normal Oral cavity: MMM, tongue normal, teeth normal Neck: supple, no lymphadenopathy, no thyromegaly, no masses, normal ROM,  no bruits Chest: non tender, normal shape and expansion Heart: RRR, normal S1, S2, no murmurs Lungs: CTA bilaterally, no wheezes, rhonchi, or rales Abdomen: +bs, soft, non tender, non distended, no masses, no hepatomegaly, no splenomegaly, no bruits Back: non tender, normal ROM, no scoliosis Musculoskeletal: upper extremities non tender, no obvious deformity, normal ROM throughout, lower extremities non tender, no obvious deformity, normal ROM throughout Extremities: no edema, no cyanosis, no  clubbing Pulses: 2+ symmetric, upper and lower extremities, normal cap refill Neurological: alert, oriented x 3, CN2-12 intact, strength normal upper extremities and lower extremities, sensation normal throughout, DTRs 2+ throughout, no cerebellar signs, gait normal Psychiatric: normal affect, behavior normal, pleasant  GU: normal male external genitalia,circumcised, nontender, no masses, no hernia, no lymphadenopathy Rectal: deferred    Assessment and Plan :   Encounter Diagnoses  Name Primary?   Encounter for health maintenance examination in adult Yes   Gastroesophageal reflux disease, unspecified whether esophagitis present    Screening for lipid disorders    Screen for STD (sexually transmitted disease)     This visit was a preventative care visit, also known as wellness visit or routine physical.   Topics typically include healthy lifestyle, diet, exercise, preventative care, vaccinations, sick and well care, proper use of emergency dept and after hours care, as well as other concerns.     Separate significant issues discussed: GERD - seeing GI    General Recommendations: Continue to return yearly for your annual wellness and preventative care visits.  This gives [place] a chance to discuss healthy lifestyle, exercise, vaccinations, review your chart record, and perform screenings where appropriate.  I recommend you see your eye doctor yearly for routine vision care.  I recommend you see your dentist yearly for routine dental care including hygiene visits twice yearly.   Vaccination  Immunization History  Administered Date(s) Administered   DTaP 05/05/2000, 07/07/2000, 09/09/2000, 09/07/2001, 03/20/2004   HIB (PRP-OMP) 05/05/2000, 07/07/2000, 09/09/2000, 03/03/2001   Hepatitis A 03/27/2007, 09/28/2007   Hepatitis B 01/16/01, 04/02/2000, 11/10/2000   Hpv-Unspecified 07/20/2012, 09/25/2012, 01/26/2013   IPV 05/05/2000, 07/07/2000, 09/09/2000, 03/20/2004    Influenza,inj,Quad PF,6+ Mos 09/28/2016, 12/06/2017   Influenza-Unspecified 11/13/2011, 11/23/2012, 11/22/2013   MMR 03/03/2001, 03/20/2004   Meningococcal B, OMV 09/10/2016, 09/19/2017   Meningococcal Conjugate 05/29/2011, 09/10/2016   Moderna Sars-Covid-2 Vaccination 03/25/2019, 04/27/2019   Pneumococcal Conjugate-13 07/07/2000, 10/22/2000, 09/07/2001, 07/20/2012   Tdap 05/29/2011   Varicella 03/03/2001, 03/21/2005     Screening for cancer: Colon cancer screening: Age 30  Testicular cancer screening You should do a monthly self testicular exam if you are between 19-48 years old, and we typically do a testicular exam on the yearly physical for this same age group.   Prostate Cancer screening: The recommended prostate cancer screening test is a blood test called the prostate-specific antigen (PSA) test. PSA is a protein that is made in the prostate. As you age, your prostate naturally produces more PSA. Abnormally high PSA levels may be caused by: Prostate cancer. An enlarged prostate that is not caused by cancer (benign prostatic hyperplasia, or BPH). This condition is very common in older men. A prostate gland infection (prostatitis) or urinary tract infection. Certain medicines such as male hormones (like testosterone) or other medicines that raise testosterone levels. A rectal exam may be done as part of prostate cancer screening to help provide information about the size of your prostate gland. When a rectal exam is performed, it should be done after the PSA level is drawn to  avoid any effect on the results.   Skin cancer screening: Check your skin regularly for new changes, growing lesions, or other lesions of concern Come in for evaluation if you have skin lesions of concern.   Lung cancer screening: If you have a greater than 20 pack year history of tobacco use, then you may qualify for lung cancer screening with a chest CT scan.   Please call your insurance company to  inquire about coverage for this test.   Pancreatic cancer:  no current screening test is available or routinely recommended. (risk factors: smoking, overweight or obese, diabetes, chronic pancreatitis, work exposure - dry cleaning, metal working, 22yo>, M>F, Tree surgeon, family hx/o, hereditary breast, ovarian, melanoma, lynch, peutz-jeghers).  Symptoms: jaundice, dark urine, light color or greasy stools, itchy skin, belly or back pain, weight loss, poor appetite, nause, vomiting, liver enlargement, DVT/blood clots.   We currently don't have screenings for other cancers besides breast, cervical, colon, and lung cancers.  If you have a strong family history of cancer or have other cancer screening concerns, please let me know.  Genetic testing referral is an option for individuals with high cancer risk in the family.  There are some other cancer screenings in development currently.   Bone health: Get at least 150 minutes of aerobic exercise weekly Get weight bearing exercise at least once weekly Bone density test:  A bone density test is an imaging test that uses a type of X-ray to measure the amount of calcium and other minerals in your bones. The test may be used to diagnose or screen you for a condition that causes weak or thin bones (osteoporosis), predict your risk for a broken bone (fracture), or determine how well your osteoporosis treatment is working. The bone density test is recommended for females 65 and older, or females or males <65 if certain risk factors such as thyroid disease, long term use of steroids such as for asthma or rheumatological issues, vitamin D deficiency, estrogen deficiency, family history of osteoporosis, self or family history of fragility fracture in first degree relative.    Heart health: Get at least 150 minutes of aerobic exercise weekly Limit alcohol It is important to maintain a healthy blood pressure and healthy cholesterol numbers  Heart disease  screening: Screening for heart disease includes screening for blood pressure, fasting lipids, glucose/diabetes screening, BMI height to weight ratio, reviewed of smoking status, physical activity, and diet.    Goals include blood pressure 120/80 or less, maintaining a healthy lipid/cholesterol profile, preventing diabetes or keeping diabetes numbers under good control, not smoking or using tobacco products, exercising most days per week or at least 150 minutes per week of exercise, and eating healthy variety of fruits and vegetables, healthy oils, and avoiding unhealthy food choices like fried food, fast food, high sugar and high cholesterol foods.    Other tests may possibly include EKG test, CT coronary calcium score, echocardiogram, exercise treadmill stress test.      Vascular disease screening: For higher risk individuals including smokers, diabetics, patients with known heart disease or high blood pressure, kidney disease, and others, screening for vascular disease or atherosclerosis of the arteries is available.  Examples may include carotid ultrasound, abdominal aortic ultrasound, ABI blood flow screening in the legs, thoracic aorta screening.   Medical care options: I recommend you continue to seek care here first for routine care.  We try really hard to have available appointments Monday through Friday daytime hours for sick visits, acute visits,  and physicals.  Urgent care should be used for after hours and weekends for significant issues that cannot wait till the next day.  The emergency department should be used for significant potentially life-threatening emergencies.  The emergency department is expensive, can often have long wait times for less significant concerns, so try to utilize primary care, urgent care, or telemedicine when possible to avoid unnecessary trips to the emergency department.  Virtual visits and telemedicine have been introduced since the pandemic started in 2020, and  can be convenient ways to receive medical care.  We offer virtual appointments as well to assist you in a variety of options to seek medical care.   Legal  Take the time to do a last will and testament, Advanced Directives including Health Care Power of Attorney and Living Will documents.  Don't leave your family with burdens that can be handled ahead of time.   Advanced Directives: I recommend you consider completing a Health Care Power of Attorney and Living Will.   These documents respect your wishes and help alleviate burdens on your loved ones if you were to become terminally ill or be in a position to need those documents enforced.    You can complete Advanced Directives yourself, have them notarized, then have copies made for our office, for you and for anybody you feel should have them in safe keeping.  Or, you can have an attorney prepare these documents.   If you haven't updated your Last Will and Testament in a while, it may be worthwhile having an attorney prepare these documents together and save on some costs.       Spiritual and Emotional Health Keeping a healthy spiritual life can help you better manage your physical health. Your spiritual life can help you to cope with any issues that may arise with your physical health.  Balance can keep [place] healthy and help [place] to recover.  If you are struggling with your spiritual health there are questions that you may want to ask yourself:  What makes me feel most complete? When do I feel most connected to the rest of the world? Where do I find the most inner strength? What am I doing when I feel whole?  Helpful tips: Being in nature. Some people feel very connected and at peace when they are walking outdoors or are outside. Helping others. Some feel the largest sense of wellbeing when they are of service to others. Being of service can take on many forms. It can be doing volunteer work, being kind to strangers, or offering a hand to a  friend in need. Gratitude. Some people find they feel the most connected when they remain grateful. They may make lists of all the things they are grateful for or say a thank you out loud for all they have.    Emotional Health Are you in tune with your emotional health?  Check out this link: http://www.marquez-love.com/    Financial Health Make sure you use a budget for your personal finances Make sure you are insured against risks (health insurance, life insurance, auto insurance, etc) Save more, spend less Set financial goals If you need help in this area, good resources include counseling through [employer] or other community resources, have a meeting with a Social research officer, government, and a good resource is the [employer]    Demarco was seen today for annual exam.  Diagnoses and all orders for this visit:  Encounter for health maintenance examination in adult -  Comprehensive metabolic panel -     CBC -     Lipid panel -     TSH -     RPR+HIV+GC+CT Panel -     Hepatitis C antibody -     Hepatitis B surface antigen  Gastroesophageal reflux disease, unspecified whether esophagitis present  Screening for lipid disorders -     Lipid panel  Screen for STD (sexually transmitted disease) -     RPR+HIV+GC+CT Panel -     Hepatitis C antibody -     Hepatitis B surface antigen     Follow-up pending labs, yearly for physical

## 2022-08-08 NOTE — Progress Notes (Signed)
Results sent through MyChart

## 2022-08-10 LAB — COMPREHENSIVE METABOLIC PANEL
ALT: 30 IU/L (ref 0–44)
AST: 40 IU/L (ref 0–40)
Albumin: 4.6 g/dL (ref 4.3–5.2)
Alkaline Phosphatase: 94 IU/L (ref 44–121)
BUN/Creatinine Ratio: 18 (ref 9–20)
BUN: 19 mg/dL (ref 6–20)
Bilirubin Total: 0.6 mg/dL (ref 0.0–1.2)
CO2: 22 mmol/L (ref 20–29)
Calcium: 9.7 mg/dL (ref 8.7–10.2)
Chloride: 101 mmol/L (ref 96–106)
Creatinine, Ser: 1.06 mg/dL (ref 0.76–1.27)
Globulin, Total: 2.6 g/dL (ref 1.5–4.5)
Glucose: 80 mg/dL (ref 70–99)
Potassium: 4.4 mmol/L (ref 3.5–5.2)
Sodium: 138 mmol/L (ref 134–144)
Total Protein: 7.2 g/dL (ref 6.0–8.5)
eGFR: 102 mL/min/{1.73_m2} (ref >59)

## 2022-08-10 LAB — LIPID PANEL
Chol/HDL Ratio: 2.5 ratio (ref 0.0–5.0)
Cholesterol, Total: 173 mg/dL (ref 100–199)
HDL: 70 mg/dL (ref >39)
LDL Chol Calc (NIH): 93 mg/dL (ref 0–99)
Triglycerides: 47 mg/dL (ref 0–149)
VLDL Cholesterol Cal: 10 mg/dL (ref 5–40)

## 2022-08-10 LAB — RPR+HIV+GC+CT PANEL
Chlamydia trachomatis, NAA: NEGATIVE
HIV Screen 4th Generation wRfx: NONREACTIVE
Neisseria Gonorrhoeae by PCR: NEGATIVE
RPR Ser Ql: NONREACTIVE

## 2022-08-10 LAB — HEPATITIS C ANTIBODY: Hep C Virus Ab: NONREACTIVE

## 2022-08-10 LAB — CBC
Hematocrit: 43.9 % (ref 37.5–51.0)
Hemoglobin: 13.6 g/dL (ref 13.0–17.7)
MCH: 26.6 pg (ref 26.6–33.0)
MCHC: 31 g/dL — ABNORMAL LOW (ref 31.5–35.7)
MCV: 86 fL (ref 79–97)
Platelets: 271 x10E3/uL (ref 150–450)
RBC: 5.11 x10E6/uL (ref 4.14–5.80)
RDW: 13.1 % (ref 11.6–15.4)
WBC: 5.4 x10E3/uL (ref 3.4–10.8)

## 2022-08-10 LAB — HEPATITIS B SURFACE ANTIGEN: Hepatitis B Surface Ag: NEGATIVE

## 2022-08-10 LAB — TSH: TSH: 0.651 u[IU]/mL (ref 0.450–4.500)

## 2022-08-12 NOTE — Progress Notes (Signed)
 Results sent through MyChart

## 2023-01-15 NOTE — Progress Notes (Signed)
[facility] - Clinic Note  01/16/2023     CHIEF COMPLAINT Patient presents for Retina Evaluation   HISTORY OF PRESENT ILLNESS: Frank Lambert is a 22 y.o. male who presents to the clinic today for:   HPI     Retina Evaluation   In right eye.  Associated Symptoms Negative for Flashes, Floaters, Distortion and Blind Spot.  I, the attending physician,  performed the HPI with the patient and updated documentation appropriately.        Comments   Patient is here today based on a referral from [facility] care for a retinal hole possibly in the right eye. He is using AT's.       Last edited by Rennis Chris, MD on 01/16/2023 11:50 AM.    Patient states the vision has not changed.  Referring physician: [facility] [address] [place],  [place] [address]  HISTORICAL INFORMATION:   Selected notes from the MEDICAL RECORD NUMBER Referred by Dr. Nedra Hai:  Ocular Hx- PMH-    CURRENT MEDICATIONS: Current Outpatient Medications (Ophthalmic Drugs)  Medication Sig   prednisoLONE acetate (PRED FORTE) 1 % ophthalmic suspension Place 1 drop into the right eye 4 (four) times daily for 7 days.   No current facility-administered medications for this visit. (Ophthalmic Drugs)   Current Outpatient Medications (Other)  Medication Sig   omeprazole (PRILOSEC) 20 MG capsule Take 20 mg by mouth daily. (Patient not taking: Reported on 01/16/2023)   No current facility-administered medications for this visit. (Other)      REVIEW OF SYSTEMS: ROS   Positive for: Gastrointestinal, Eyes, Allergic/Imm Last edited by Charlette Caffey, COT on 01/16/2023  8:38 AM.       ALLERGIES No Known Allergies  PAST MEDICAL HISTORY Past Medical History:  Diagnosis Date   ADHD (attention deficit hyperactivity disorder), inattentive type    per [facility] records   GERD (gastroesophageal reflux disease)    Lactose intolerance    Seasonal allergies    Past  Surgical History:  Procedure Laterality Date   MYRINGOTOMY     tubes in ears     WISDOM TOOTH EXTRACTION      FAMILY HISTORY Family History  Problem Relation Age of Onset   GER disease Mother    GER disease Father    Cancer Maternal Grandmother        breast   High blood pressure Maternal Grandfather    Cancer Paternal Grandmother        pancreatic   Heart disease Neg Hx    Stroke Neg Hx     SOCIAL HISTORY Social History   Tobacco Use   Smoking status: Never   Smokeless tobacco: Never   Tobacco comments:    No smokers at home  Vaping Use   Vaping status: Never Used  Substance Use Topics   Alcohol use: Yes    Comment: special occasion   Drug use: No         OPHTHALMIC EXAM:  Base Eye Exam     Visual Acuity (Snellen - Linear)       Right Left   Dist cc 20/20 20/20    Correction: Glasses         Tonometry (Tonopen, 8:44 AM)       Right Left   Pressure 14 13         Pupils       Dark Light Shape React APD   Right 3  2 Round Brisk None   Left 3 2 Round Brisk None         Visual Fields       Left Right    Full Full         Extraocular Movement       Right Left    Full, Ortho Full, Ortho         Neuro/Psych     Oriented x3: Yes         Dilation     Both eyes: 1.0% Mydriacyl, 2.5% Phenylephrine @ 8:39 AM           Slit Lamp and Fundus Exam     External Exam       Right Left   External Normal Normal         Slit Lamp Exam       Right Left   Lids/Lashes Normal Normal   Conjunctiva/Sclera Mild Melanosis Mild Melanosis   Cornea Clear Clear   Anterior Chamber Deep and clear Deep and clear   Iris Round and dilated Round and dilated   Lens Clear Clear   Anterior Vitreous Normal Normal         Fundus Exam       Right Left   Disc Pink and sharp Pink and sharp, mild PPP   C/D Ratio 0.3 0.2   Macula Flat, Good foveal reflex, No heme or edema Flat, Good foveal reflex, No heme or edema   Vessels Normal  Normal   Periphery Attached, mild White without pressure temporally, pigment VR tuft /break 1030, mild peripheral lattice temporally Attached, White without pressure temporally, mild peripheral lattice           Refraction     Wearing Rx       Sphere Cylinder Axis   Right -3.75 +0.50 077   Left -3.00 +0.75 109            IMAGING AND PROCEDURES  Imaging and Procedures for 01/16/2023  OCT, Retina - OU - Both Eyes       Right Eye Quality was good. Central Foveal Thickness: 242. Progression has no prior data. Findings include normal foveal contour, no IRF, no SRF, vitreomacular adhesion .   Left Eye Quality was good. Central Foveal Thickness: 242. Progression has no prior data. Findings include normal foveal contour, no IRF, no SRF, vitreomacular adhesion .   Notes *Images captured and stored on drive  Diagnosis / Impression:  NFP; no IRF/SRF OU  Clinical management:  See below  Abbreviations: NFP - Normal foveal profile. CME - cystoid macular edema. PED - pigment epithelial detachment. IRF - intraretinal fluid. SRF - subretinal fluid. EZ - ellipsoid zone. ERM - epiretinal membrane. ORA - outer retinal atrophy. ORT - outer retinal tubulation. SRHM - subretinal hyper-reflective material. IRHM - intraretinal hyper-reflective material      Repair Retinal Breaks, Laser - OD - Right Eye       Anesthesia Anesthetic medications included Proparacaine 0.5%.   Notes LASER PROCEDURE NOTE  Procedure:  Barrier laser retinopexy using slit lamp laser, RIGHT eye   Diagnosis:   Retinal break and peripheral lattice degeneration, RIGHT eye                     Pigmented retinal break at 1030                        Mild peripheral lattice temporal hemisphere  Surgeon: Arlys John  Vanessa Barbara, MD, PhD  Anesthesia: Topical  Informed consent obtained, operative eye marked, and time out performed prior to initiation of laser.   Laser settings:  Lumenis Smart532 laser, slit lamp Lens:  Mainster PRP 165 Power: 250 mW Spot size: 200 microns Duration: 30 msec  # spots: 448  Placement of laser: Using a Mainster PRP 165 contact lens at the slit lamp, laser was placed in three confluent rows around pigmented retinal break at 1030 and posterior to patches of lattice in temporal periphery.  Complications: None.  Patient tolerated the procedure well and received written and verbal post-procedure care information/education.           ASSESSMENT/PLAN:    ICD-10-CM   1. Retinal break of right eye  H33.301 OCT, Retina - OU - Both Eyes    Repair Retinal Breaks, Laser - OD - Right Eye    2. 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   - The incidence, risk factors, and natural history of retinal tear was discussed with patient.   - Potential treatment options including laser retinopexy and cryotherapy discussed with patient. - pigmented VR tuft /break 1030 - recommend laser retinopexy OD today 12.19.24 - RBA of procedure discussed, questions answered - informed consent obtained and signed - see procedure note - start PF QID x7 days - sent to pharmacy on file - f/u in 3-4 wks, DFE, OCT  2. Lattice degeneration, OU - Mild peripheral lattice in both eyes - discussed findings, prognosis, and treatment options including observation - reviewed sign/symptoms of retinal tear and detachment  - recommend laser retinopexy OD along with treatment for retinal break as above -- see above - will monitor OS for now   Ophthalmic Meds Ordered this visit:  Meds ordered this encounter  Medications   prednisoLONE acetate (PRED FORTE) 1 % ophthalmic suspension    Sig: Place 1 drop into the right eye 4 (four) times daily for 7 days.    Dispense:  1.4 mL    Refill:  0     Return in about 3 weeks (around 02/06/2023) for f/u Retinal hole OD, DFE, OCT.  There are no Patient Instructions on file for this visit.   Explained the diagnoses,  plan, and follow up with the patient and they expressed understanding.  Patient expressed understanding of the importance of proper follow up care.   This document serves as a record of services personally performed by Karie Chimera, MD, PhD. It was created on their behalf by Annalee Genta, COMT. The creation of this record is the provider's dictation and/or activities during the visit.  Electronically signed by: Annalee Genta, COMT 01/16/23 12:07 PM  This document serves as a record of services personally performed by Karie Chimera, MD, PhD. It was created on their behalf by Charlette Caffey, COT an ophthalmic technician. The creation of this record is the provider's dictation and/or activities during the visit.    Electronically signed by:  Charlette Caffey, COT  01/16/23 12:07 PM   Karie Chimera, M.D., Ph.D. Diseases & Surgery of the Retina and Vitreous [facility]  I have reviewed the above documentation for accuracy and completeness, and I agree with the above. Karie Chimera, M.D., Ph.D. 01/16/23 12:07 PM   Abbreviations: M myopia (nearsighted); A astigmatism; H hyperopia (farsighted); P presbyopia; Mrx spectacle prescription;  CTL contact lenses; OD right eye; OS left eye; OU  both eyes  XT exotropia; ET esotropia; PEK punctate epithelial keratitis; PEE punctate epithelial erosions; DES dry eye syndrome; MGD meibomian gland dysfunction; ATs artificial tears; PFAT's preservative free artificial tears; NSC nuclear sclerotic cataract; PSC posterior subcapsular cataract; ERM epi-retinal membrane; PVD posterior vitreous detachment; RD retinal detachment; DM diabetes mellitus; DR diabetic retinopathy; NPDR non-proliferative diabetic retinopathy; PDR proliferative diabetic retinopathy; CSME clinically significant macular edema; DME diabetic macular edema; dbh dot blot hemorrhages; CWS cotton wool spot; POAG primary open angle glaucoma; C/D cup-to-disc ratio; HVF  humphrey visual field; GVF goldmann visual field; OCT optical coherence tomography; IOP intraocular pressure; BRVO Branch retinal vein occlusion; CRVO central retinal vein occlusion; CRAO central retinal artery occlusion; BRAO branch retinal artery occlusion; RT retinal tear; SB scleral buckle; PPV pars plana vitrectomy; VH Vitreous hemorrhage; PRP panretinal laser photocoagulation; IVK intravitreal kenalog; VMT vitreomacular traction; MH Macular hole;  NVD neovascularization of the disc; NVE neovascularization elsewhere; AREDS age related eye disease study; ARMD age related macular degeneration; POAG primary open angle glaucoma; EBMD epithelial/anterior basement membrane dystrophy; ACIOL anterior chamber intraocular lens; IOL intraocular lens; PCIOL posterior chamber intraocular lens; Phaco/IOL phacoemulsification with intraocular lens placement; PRK photorefractive keratectomy; LASIK laser assisted in situ keratomileusis; HTN hypertension; DM diabetes mellitus; COPD chronic obstructive pulmonary disease

## 2023-01-16 ENCOUNTER — Ambulatory Visit (INDEPENDENT_AMBULATORY_CARE_PROVIDER_SITE_OTHER): Payer: 59 | Admitting: Ophthalmology

## 2023-01-16 ENCOUNTER — Encounter (INDEPENDENT_AMBULATORY_CARE_PROVIDER_SITE_OTHER): Payer: Self-pay | Admitting: Ophthalmology

## 2023-01-16 DIAGNOSIS — H35413 Lattice degeneration of retina, bilateral: Secondary | ICD-10-CM

## 2023-01-16 DIAGNOSIS — H33301 Unspecified retinal break, right eye: Secondary | ICD-10-CM | POA: Diagnosis not present

## 2023-01-16 MED ORDER — PREDNISOLONE ACETATE 1 % OP SUSP: 1.0000 [drp] | Freq: Four times a day (QID) | OPHTHALMIC | 0 refills | Status: AC

## 2023-02-06 NOTE — Progress Notes (Signed)
 [facility] - Clinic Note  02/07/2023     CHIEF COMPLAINT Patient presents for Retina Follow Up   HISTORY OF PRESENT ILLNESS: Frank Lambert is a 23 y.o. male who presents to the clinic today for:   HPI     Retina Follow Up   Patient presents with  Other.  I, the attending physician,  performed the HPI with the patient and updated documentation appropriately.        Comments   3 week S/P laser break od. Patient states after laser he had a really bad migraine on the right side. Also Patient states his eyelid on the right side has been twitching       Last edited by Valdemar Rogue, MD on 02/08/2023  2:02 AM.     Patient states that he had a migraine over the right eye after the laser for about 2 hours.  Referring physician: Bulah Alm RAMAN, PA-C [address] [place],  [place] [address]  HISTORICAL INFORMATION:   Selected notes from the MEDICAL RECORD NUMBER Referred by Dr. JAMA:  Ocular Hx- PMH-    CURRENT MEDICATIONS: No current outpatient medications on file. (Ophthalmic Drugs)   No current facility-administered medications for this visit. (Ophthalmic Drugs)   Current Outpatient Medications (Other)  Medication Sig   omeprazole (PRILOSEC) 20 MG capsule Take 20 mg by mouth daily. (Patient not taking: Reported on 02/07/2023)   No current facility-administered medications for this visit. (Other)      REVIEW OF SYSTEMS: ROS   Positive for: Gastrointestinal, Eyes, Allergic/Imm Last edited by German Olam BRAVO, COT on 02/07/2023  7:55 AM.        ALLERGIES No Known Allergies  PAST MEDICAL HISTORY Past Medical History:  Diagnosis Date   ADHD (attention deficit hyperactivity disorder), inattentive type    per [facility] records   GERD (gastroesophageal reflux disease)    Lactose intolerance    Seasonal allergies    Past Surgical History:  Procedure Laterality Date   MYRINGOTOMY     tubes in ears     WISDOM TOOTH EXTRACTION       FAMILY HISTORY Family History  Problem Relation Age of Onset   GER disease Mother    GER disease Father    Cancer Maternal Grandmother        breast   High blood pressure Maternal Grandfather    Cancer Paternal Grandmother        pancreatic   Heart disease Neg Hx    Stroke Neg Hx     SOCIAL HISTORY Social History   Tobacco Use   Smoking status: Never   Smokeless tobacco: Never   Tobacco comments:    No smokers at home  Vaping Use   Vaping status: Never Used  Substance Use Topics   Alcohol use: Yes    Comment: special occasion   Drug use: No         OPHTHALMIC EXAM:  Base Eye Exam     Visual Acuity (Snellen - Linear)       Right Left   Dist cc 20/20 20/20    Correction: Glasses         Tonometry (Tonopen, 8:03 AM)       Right Left   Pressure 13          Pupils       Dark Light Shape React APD   Right 3 2 Round Brisk None   Left 3 2 Round Brisk None  Visual Fields       Left Right    Full Full         Extraocular Movement       Right Left    Full, Ortho Full, Ortho         Neuro/Psych     Oriented x3: Yes   Mood/Affect: Normal         Dilation     Right eye: 1.0% Mydriacyl, 2.5% Phenylephrine @ 8:03 AM           Slit Lamp and Fundus Exam     External Exam       Right Left   External Normal Normal         Slit Lamp Exam       Right Left   Lids/Lashes Normal Normal   Conjunctiva/Sclera Mild Melanosis Mild Melanosis   Cornea Clear Clear   Anterior Chamber Deep and clear Deep and clear   Iris Round and dilated Round and dilated   Lens Clear Clear   Anterior Vitreous Normal Normal         Fundus Exam       Right Left   Disc Pink and sharp Pink and sharp, mild PPP   C/D Ratio 0.3 0.2   Macula Flat, Good foveal reflex, No heme or edema Flat, Good foveal reflex, No heme or edema   Vessels Normal Normal   Periphery Attached, mild White without pressure temporally, pigment VR tuft /break  1030-- good laser surrounding, mild peripheral lattice temporally- - good laser surrounding Attached, White without pressure temporally, mild peripheral lattice           Refraction     Wearing Rx       Sphere Cylinder Axis   Right -3.75 +0.50 077   Left -3.00 +0.75 109            IMAGING AND PROCEDURES  Imaging and Procedures for 02/07/2023  OCT, Retina - OU - Both Eyes       Right Eye Quality was good. Central Foveal Thickness: 243. Progression has been stable. Findings include normal foveal contour, no IRF, no SRF, vitreomacular adhesion .   Left Eye Quality was good. Central Foveal Thickness: 247. Progression has been stable. Findings include normal foveal contour, no IRF, no SRF, vitreomacular adhesion .   Notes *Images captured and stored on drive  Diagnosis / Impression:  NFP; no IRF/SRF OU  Clinical management:  See below  Abbreviations: NFP - Normal foveal profile. CME - cystoid macular edema. PED - pigment epithelial detachment. IRF - intraretinal fluid. SRF - subretinal fluid. EZ - ellipsoid zone. ERM - epiretinal membrane. ORA - outer retinal atrophy. ORT - outer retinal tubulation. SRHM - subretinal hyper-reflective material. IRHM - intraretinal hyper-reflective material             ASSESSMENT/PLAN:    ICD-10-CM   1. Retinal break of right eye  H33.301 OCT, Retina - OU - Both Eyes    2. 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   - pigmented VR tuft /break 1030 - s/p laser retinopexy OD 12.19.24 -- good laser changes in place - completed PF QID x7 days - no new RT/RD - f/u 3 months DFE, OCT, dilate OU  2. Lattice degeneration, OU - Mild peripheral lattice in both eyes - discussed findings, prognosis, and treatment options including observation - reviewed sign/symptoms of retinal tear and detachment  -  s/p laser retinopexy OD 12.19.24 as above - will monitor OS for now   Ophthalmic Meds  Ordered this visit:  No orders of the defined types were placed in this encounter.    Return in about 3 months (around 05/08/2023) for f/u Retinal break OD , DFE, OCT, dilate OU.  There are no Patient Instructions on file for this visit.   This document serves as a record of services personally performed by Redell JUDITHANN Hans, MD, PhD. It was created on their behalf by Delon Newness COT, an ophthalmic technician. The creation of this record is the provider's dictation and/or activities during the visit.    Electronically signed by: Delon Newness COT 01.09.25 2:03 AM  This document serves as a record of services personally performed by Redell JUDITHANN Hans, MD, PhD. It was created on their behalf by Wanda GEANNIE Keens, COT an ophthalmic technician. The creation of this record is the provider's dictation and/or activities during the visit.    Electronically signed by:  Wanda GEANNIE Keens, COT  02/08/23 2:03 AM  Redell JUDITHANN Hans, M.D., Ph.D. Diseases & Surgery of the Retina and Vitreous [facility] 02/07/2023   I have reviewed the above documentation for accuracy and completeness, and I agree with the above. Redell JUDITHANN Hans, M.D., Ph.D. 02/08/23 2:05 AM   Abbreviations: M myopia (nearsighted); A astigmatism; H hyperopia (farsighted); P presbyopia; Mrx spectacle prescription;  CTL contact lenses; OD right eye; OS left eye; OU both eyes  XT exotropia; ET esotropia; PEK punctate epithelial keratitis; PEE punctate epithelial erosions; DES dry eye syndrome; MGD meibomian gland dysfunction; ATs artificial tears; PFAT's preservative free artificial tears; NSC nuclear sclerotic cataract; PSC posterior subcapsular cataract; ERM epi-retinal membrane; PVD posterior vitreous detachment; RD retinal detachment; DM diabetes mellitus; DR diabetic retinopathy; NPDR non-proliferative diabetic retinopathy; PDR proliferative diabetic retinopathy; CSME clinically significant macular  edema; DME diabetic macular edema; dbh dot blot hemorrhages; CWS cotton wool spot; POAG primary open angle glaucoma; C/D cup-to-disc ratio; HVF humphrey visual field; GVF goldmann visual field; OCT optical coherence tomography; IOP intraocular pressure; BRVO Branch retinal vein occlusion; CRVO central retinal vein occlusion; CRAO central retinal artery occlusion; BRAO branch retinal artery occlusion; RT retinal tear; SB scleral buckle; PPV pars plana vitrectomy; VH Vitreous hemorrhage; PRP panretinal laser photocoagulation; IVK intravitreal kenalog; VMT vitreomacular traction; MH Macular hole;  NVD neovascularization of the disc; NVE neovascularization elsewhere; AREDS age related eye disease study; ARMD age related macular degeneration; POAG primary open angle glaucoma; EBMD epithelial/anterior basement membrane dystrophy; ACIOL anterior chamber intraocular lens; IOL intraocular lens; PCIOL posterior chamber intraocular lens; Phaco/IOL phacoemulsification with intraocular lens placement; PRK photorefractive keratectomy; LASIK laser assisted in situ keratomileusis; HTN hypertension; DM diabetes mellitus; COPD chronic obstructive pulmonary disease

## 2023-02-07 ENCOUNTER — Ambulatory Visit (INDEPENDENT_AMBULATORY_CARE_PROVIDER_SITE_OTHER): Payer: 59 | Admitting: Ophthalmology

## 2023-02-07 ENCOUNTER — Encounter (INDEPENDENT_AMBULATORY_CARE_PROVIDER_SITE_OTHER): Payer: Self-pay | Admitting: Ophthalmology

## 2023-02-07 DIAGNOSIS — H33301 Unspecified retinal break, right eye: Secondary | ICD-10-CM | POA: Diagnosis not present

## 2023-02-07 DIAGNOSIS — H35413 Lattice degeneration of retina, bilateral: Secondary | ICD-10-CM | POA: Diagnosis not present

## 2023-02-08 ENCOUNTER — Encounter (INDEPENDENT_AMBULATORY_CARE_PROVIDER_SITE_OTHER): Payer: Self-pay | Admitting: Ophthalmology

## 2023-02-12 ENCOUNTER — Encounter (INDEPENDENT_AMBULATORY_CARE_PROVIDER_SITE_OTHER): Payer: 59 | Admitting: Ophthalmology

## 2023-05-05 NOTE — Progress Notes (Signed)
 [facility] - Clinic Note  05/07/2023     CHIEF COMPLAINT Patient presents for Retina Follow Up   HISTORY OF PRESENT ILLNESS: Frank Lambert is a 23 y.o. male who presents to the clinic today for:   HPI     Retina Follow Up   Patient presents with  Retinal Break/Detachment.  In right eye.  Severity is mild.  Duration of 3 months.  Since onset it is stable.  I, the attending physician,  performed the HPI with the patient and updated documentation appropriately.        Comments   3 month Retina eval. Patient states no vision changes. No flashes no floaters. Checked vision with J card      Last edited by Rennis Chris, MD on 05/07/2023  8:30 AM.      Patient states   Referring physician: Jac Canavan, PA-C [address] [place],  [place] [address]  HISTORICAL INFORMATION:   Selected notes from the MEDICAL RECORD NUMBER Referred by Dr. Nedra Hai:  Ocular Hx- PMH-    CURRENT MEDICATIONS: No current outpatient medications on file. (Ophthalmic Drugs)   No current facility-administered medications for this visit. (Ophthalmic Drugs)   Current Outpatient Medications (Other)  Medication Sig   omeprazole (PRILOSEC) 20 MG capsule Take 20 mg by mouth daily. (Patient not taking: Reported on 05/07/2023)   No current facility-administered medications for this visit. (Other)   REVIEW OF SYSTEMS: ROS   Positive for: Gastrointestinal, Eyes, Allergic/Imm Last edited by Lana Fish, COT on 05/07/2023  7:33 AM.     ALLERGIES No Known Allergies  PAST MEDICAL HISTORY Past Medical History:  Diagnosis Date   ADHD (attention deficit hyperactivity disorder), inattentive type    per [facility] records   GERD (gastroesophageal reflux disease)    Lactose intolerance    Seasonal allergies    Past Surgical History:  Procedure Laterality Date   MYRINGOTOMY     tubes in ears     WISDOM TOOTH EXTRACTION     FAMILY HISTORY Family History  Problem Relation Age of  Onset   GER disease Mother    GER disease Father    Cancer Maternal Grandmother        breast   High blood pressure Maternal Grandfather    Cancer Paternal Grandmother        pancreatic   Heart disease Neg Hx    Stroke Neg Hx    SOCIAL HISTORY Social History   Tobacco Use   Smoking status: Never   Smokeless tobacco: Never   Tobacco comments:    No smokers at home  Vaping Use   Vaping status: Never Used  Substance Use Topics   Alcohol use: Yes    Comment: special occasion   Drug use: No       OPHTHALMIC EXAM:  Base Eye Exam     Visual Acuity (Snellen - Linear)       Right Left   Dist  20/20 20/20         Tonometry (Tonopen, 7:42 AM)       Right Left   Pressure 12 12         Pupils       Dark Light Shape React APD   Right 3 2 Round Brisk None   Left 3 2 Round Brisk None         Visual Fields (Counting fingers)       Left Right    Full Full  Extraocular Movement       Right Left    Full, Ortho Full, Ortho         Neuro/Psych     Oriented x3: Yes   Mood/Affect: Normal         Dilation     Both eyes: 1.0% Mydriacyl, 2.5% Phenylephrine @ 7:42 AM           Slit Lamp and Fundus Exam     External Exam       Right Left   External Normal Normal         Slit Lamp Exam       Right Left   Lids/Lashes Normal Normal   Conjunctiva/Sclera Mild Melanosis Mild Melanosis   Cornea Clear Clear   Anterior Chamber Deep and clear Deep and clear   Iris Round and dilated Round and dilated   Lens Clear Clear   Anterior Vitreous Normal Normal         Fundus Exam       Right Left   Disc Pink and sharp Pink and sharp, mild PPP   C/D Ratio 0.3 0.2   Macula Flat, Good foveal reflex, No heme or edema Flat, Good foveal reflex, No heme or edema   Vessels Normal Normal   Periphery Attached, mild White without pressure temporally, pigment VR tuft /break at 1030-- good laser surrounding, mild peripheral lattice temporally --  good peripheral laser from 0600-1100 Attached, White without pressure temporally, mild peripheral lattice with atrophic micro holes from 0300-0600           Refraction     Wearing Rx       Sphere Cylinder Axis   Right -3.75 +0.50 077   Left -3.00 +0.75 109            IMAGING AND PROCEDURES  Imaging and Procedures for 05/07/2023  OCT, Retina - OU - Both Eyes       Right Eye Quality was good. Central Foveal Thickness: 246. Progression has been stable. Findings include normal foveal contour, no IRF, no SRF, vitreomacular adhesion .   Left Eye Quality was good. Central Foveal Thickness: 247. Progression has been stable. Findings include normal foveal contour, no IRF, no SRF, vitreomacular adhesion .   Notes *Images captured and stored on drive  Diagnosis / Impression:  NFP; no IRF/SRF OU  Clinical management:  See below  Abbreviations: NFP - Normal foveal profile. CME - cystoid macular edema. PED - pigment epithelial detachment. IRF - intraretinal fluid. SRF - subretinal fluid. EZ - ellipsoid zone. ERM - epiretinal membrane. ORA - outer retinal atrophy. ORT - outer retinal tubulation. SRHM - subretinal hyper-reflective material. IRHM - intraretinal hyper-reflective material            ASSESSMENT/PLAN:    ICD-10-CM   1. Retinal break of right eye  H33.301 OCT, Retina - OU - Both Eyes    2. Lattice degeneration of peripheral retina, bilateral  H35.413 OCT, Retina - OU - Both Eyes     Retinal break, OD.   - pigmented VR tuft /break 1030 - s/p laser retinopexy OD 12.19.24 -- good laser surrounding - no new RT/RD or lattice - f/u 1 yr -  DFE, OCT, dilate OU  2. Lattice degeneration, OU - peripheral lattice w/ microholes in IT periphery OU - s/p laser retinopexy OD 12.19.24 as above - recommend laser retinopexy OS in May / June -- pt in agreement and will schedule   Ophthalmic Meds Ordered this visit:  No orders of the defined types were placed in this  encounter.    Return for f/u iin May or June for lattice w/ holes OU, DFE, OCT, Laser OS.  There are no Patient Instructions on file for this visit.  This document serves as a record of services personally performed by Karie Chimera, MD, PhD. It was created on their behalf by Glee Arvin. Manson Passey, OA an ophthalmic technician. The creation of this record is the provider's dictation and/or activities during the visit.    Electronically signed by: Glee Arvin. Manson Passey, OA 05/07/23 8:33 AM  Karie Chimera, M.D., Ph.D. Diseases & Surgery of the Retina and Vitreous [facility] 05/07/2023   I have reviewed the above documentation for accuracy and completeness, and I agree with the above. Karie Chimera, M.D., Ph.D. 05/07/23 8:33 AM   Abbreviations: M myopia (nearsighted); A astigmatism; H hyperopia (farsighted); P presbyopia; Mrx spectacle prescription;  CTL contact lenses; OD right eye; OS left eye; OU both eyes  XT exotropia; ET esotropia; PEK punctate epithelial keratitis; PEE punctate epithelial erosions; DES dry eye syndrome; MGD meibomian gland dysfunction; ATs artificial tears; PFAT's preservative free artificial tears; NSC nuclear sclerotic cataract; PSC posterior subcapsular cataract; ERM epi-retinal membrane; PVD posterior vitreous detachment; RD retinal detachment; DM diabetes mellitus; DR diabetic retinopathy; NPDR non-proliferative diabetic retinopathy; PDR proliferative diabetic retinopathy; CSME clinically significant macular edema; DME diabetic macular edema; dbh dot blot hemorrhages; CWS cotton wool spot; POAG primary open angle glaucoma; C/D cup-to-disc ratio; HVF humphrey visual field; GVF goldmann visual field; OCT optical coherence tomography; IOP intraocular pressure; BRVO Branch retinal vein occlusion; CRVO central retinal vein occlusion; CRAO central retinal artery occlusion; BRAO branch retinal artery occlusion; RT retinal tear; SB scleral buckle; PPV pars plana  vitrectomy; VH Vitreous hemorrhage; PRP panretinal laser photocoagulation; IVK intravitreal kenalog; VMT vitreomacular traction; MH Macular hole;  NVD neovascularization of the disc; NVE neovascularization elsewhere; AREDS age related eye disease study; ARMD age related macular degeneration; POAG primary open angle glaucoma; EBMD epithelial/anterior basement membrane dystrophy; ACIOL anterior chamber intraocular lens; IOL intraocular lens; PCIOL posterior chamber intraocular lens; Phaco/IOL phacoemulsification with intraocular lens placement; PRK photorefractive keratectomy; LASIK laser assisted in situ keratomileusis; HTN hypertension; DM diabetes mellitus; COPD chronic obstructive pulmonary disease

## 2023-05-07 ENCOUNTER — Encounter (INDEPENDENT_AMBULATORY_CARE_PROVIDER_SITE_OTHER): Payer: Self-pay | Admitting: Ophthalmology

## 2023-05-07 ENCOUNTER — Ambulatory Visit (INDEPENDENT_AMBULATORY_CARE_PROVIDER_SITE_OTHER): Payer: 59 | Admitting: Ophthalmology

## 2023-05-07 DIAGNOSIS — H35413 Lattice degeneration of retina, bilateral: Secondary | ICD-10-CM

## 2023-05-07 DIAGNOSIS — H33301 Unspecified retinal break, right eye: Secondary | ICD-10-CM | POA: Diagnosis not present

## 2023-06-13 NOTE — Progress Notes (Signed)
 [facility] - Clinic Note  06/25/2023     CHIEF COMPLAINT Patient presents for Retina Follow Up   HISTORY OF PRESENT ILLNESS: Frank Lambert is a 23 y.o. male who presents to the clinic today for:   HPI     Retina Follow Up   In left eye.  This started 7 weeks ago.  Duration of 7 weeks.  Since onset it is stable.  I, the attending physician,  performed the HPI with the patient and updated documentation appropriately.        Comments   7 week retina follow up lattice pt is reporting no vision changes noticed he denies any flashes or floaters       Last edited by Ronelle Coffee, MD on 06/25/2023 11:59 AM.    Patient here for laser retinopexy OS  Referring physician: Claudene Crystal, PA-C [address] [place],  [place] [address]  HISTORICAL INFORMATION:   Selected notes from the MEDICAL RECORD NUMBER Referred by Dr. Merlyn Starring:  Ocular Hx- PMH-    CURRENT MEDICATIONS: Current Outpatient Medications (Ophthalmic Drugs)  Medication Sig   prednisoLONE  acetate (PRED FORTE ) 1 % ophthalmic suspension Place 1 drop into the left eye 4 (four) times daily for 7 days.   No current facility-administered medications for this visit. (Ophthalmic Drugs)   Current Outpatient Medications (Other)  Medication Sig   omeprazole (PRILOSEC) 20 MG capsule Take 20 mg by mouth daily. (Patient not taking: Reported on 05/07/2023)   No current facility-administered medications for this visit. (Other)   REVIEW OF SYSTEMS: ROS   Positive for: Gastrointestinal, Eyes, Allergic/Imm Last edited by Alise Appl, COT on 06/25/2023  9:39 AM.      ALLERGIES No Known Allergies  PAST MEDICAL HISTORY Past Medical History:  Diagnosis Date   ADHD (attention deficit hyperactivity disorder), inattentive type    per [facility] records   GERD (gastroesophageal reflux disease)    Lactose intolerance    Seasonal allergies    Past Surgical History:  Procedure Laterality Date    MYRINGOTOMY     tubes in ears     WISDOM TOOTH EXTRACTION     FAMILY HISTORY Family History  Problem Relation Age of Onset   GER disease Mother    GER disease Father    Cancer Maternal Grandmother        breast   High blood pressure Maternal Grandfather    Cancer Paternal Grandmother        pancreatic   Heart disease Neg Hx    Stroke Neg Hx    SOCIAL HISTORY Social History   Tobacco Use   Smoking status: Never   Smokeless tobacco: Never   Tobacco comments:    No smokers at home  Vaping Use   Vaping status: Never Used  Substance Use Topics   Alcohol use: Yes    Comment: special occasion   Drug use: No       OPHTHALMIC EXAM:  Base Eye Exam     Visual Acuity (Snellen - Linear)       Right Left   Dist cc 20/20 20/20 -1    Correction: Glasses         Tonometry (Tonopen, 9:44 AM)       Right Left   Pressure 15 16         Pupils       Pupils Dark Light Shape React APD   Right PERRL 3 2 Round Brisk None   Left  PERRL 3 2 Round Brisk None         Visual Fields       Left Right    Full Full         Extraocular Movement       Right Left    Full Full         Neuro/Psych     Oriented x3: Yes   Mood/Affect: Normal         Dilation     Left eye: 2.5% Phenylephrine @ 9:44 AM           Slit Lamp and Fundus Exam     External Exam       Right Left   External Normal Normal         Slit Lamp Exam       Right Left   Lids/Lashes Normal Normal   Conjunctiva/Sclera Mild Melanosis Mild Melanosis   Cornea Clear Clear   Anterior Chamber Deep and clear Deep and clear   Iris Round and dilated Round and dilated   Lens Clear Clear   Anterior Vitreous Normal Normal         Fundus Exam       Right Left   Disc Pink and sharp Pink and sharp, mild PPP   C/D Ratio 0.3 0.2   Macula Flat, Good foveal reflex, No heme or edema Flat, Good foveal reflex, No heme or edema   Vessels Normal Normal   Periphery Attached, mild White  without pressure temporally, pigment VR tuft /break at 1030-- good laser surrounding, mild peripheral lattice temporally -- good peripheral laser from 0600-1100 Attached, White without pressure temporally, mild peripheral lattice with atrophic micro holes from 0300-0600           Refraction     Wearing Rx       Sphere Cylinder Axis   Right -3.75 +0.50 077   Left -3.00 +0.75 109            IMAGING AND PROCEDURES  Imaging and Procedures for 06/25/2023  OCT, Retina - OU - Both Eyes        Right Eye Quality was good. Central Foveal Thickness: 247. Progression has been stable. Findings include normal foveal contour, no IRF, no SRF, vitreomacular adhesion .   Left Eye Quality was good. Central Foveal Thickness: 244. Progression has been stable. Findings include normal foveal contour, no IRF, no SRF, vitreomacular adhesion .   Notes  *Images captured and stored on drive  Diagnosis / Impression:  NFP; no IRF/SRF OU  Clinical management:  See below  Abbreviations: NFP - Normal foveal profile. CME - cystoid macular edema. PED - pigment epithelial detachment. IRF - intraretinal fluid. SRF - subretinal fluid. EZ - ellipsoid zone. ERM - epiretinal membrane. ORA - outer retinal atrophy. ORT - outer retinal tubulation. SRHM - subretinal hyper-reflective material. IRHM - intraretinal hyper-reflective material      Repair Retinal Breaks, Laser - OS - Left Eye        LASER PROCEDURE NOTE  Procedure:  Barrier laser retinopexy using slit lamp laser, LEFT eye   Diagnosis:   Lattice degeneration w/ atrophic holes, LEFT eye                     Patches of lattice: temporal periphery from 0130-0600  Surgeon: Ronelle Coffee, MD, PhD  Anesthesia: Topical  Informed consent obtained, operative eye marked, and time out performed prior to initiation of laser.   Laser  settings:  Lumenis [id number] laser, slit lamp Lens: Mainster PRP 165 Power: 220 mW Spot size: 500 microns Duration:  10 msec  # spots: 502  Placement of laser: Using a Mainster PRP 165 contact lens at the slit lamp, laser was placed in three+ confluent rows posterior to patches of peripheral lattice w/ atrophic holes from 0130-0600 oclock.  Complications: None.  Patient tolerated the procedure well and received written and verbal post-procedure care information/education.           ASSESSMENT/PLAN:    ICD-10-CM   1. Retinal break of both eyes  H33.303 OCT, Retina - OU - Both Eyes    Repair Retinal Breaks, Laser - OS - Left Eye    2. Lattice degeneration of peripheral retina, bilateral  H35.413 Repair Retinal Breaks, Laser - OS - Left Eye      Retinal break, OD.   - pigmented VR tuft /break 1030 - s/p laser retinopexy OD 12.19.24 -- good laser surrounding - no new RT/RD or lattice - f/u 1 yr -  DFE, OCT, dilate OU  1,2. Lattice degeneration w/ atrophic holes, OU - peripheral lattice w/ microholes in temporal periphery OU - s/p laser retinopexy OD 12.19.24 as above - recommend laser retinopexy OS today, 5.28.25 - pt wishes to proceed with laser - RBA of procedure discussed, questions answered - informed consent obtained and signed - see procedure note - start PF QID OS x7 days - f/u 2-3 weeks, DFE, OCT   Ophthalmic Meds Ordered this visit:  Meds ordered this encounter  Medications   prednisoLONE  acetate (PRED FORTE ) 1 % ophthalmic suspension    Sig: Place 1 drop into the left eye 4 (four) times daily for 7 days.    Dispense:  5 mL    Refill:  0     Return for f/u 2-3 weeks lattice OU, DFE, OCT.  There are no Patient Instructions on file for this visit.  This document serves as a record of services personally performed by Jeanice Millard, MD, PhD. It was created on their behalf by Angelia Kelp, an ophthalmic technician. The creation of this record is the provider's dictation and/or activities during the visit.    Electronically signed by: Angelia Kelp, OA, 06/25/23   12:24 PM  This document serves as a record of services personally performed by Jeanice Millard, MD, PhD. It was created on their behalf by Morley Arabia. Bevin Bucks, OA an ophthalmic technician. The creation of this record is the provider's dictation and/or activities during the visit.    Electronically signed by: Morley Arabia. Bevin Bucks, OA 06/25/23 12:24 PM  Jeanice Millard, M.D., Ph.D. Diseases & Surgery of the Retina and Vitreous [facility] 06/25/2023   I have reviewed the above documentation for accuracy and completeness, and I agree with the above. Jeanice Millard, M.D., Ph.D. 06/25/23 12:25 PM   Abbreviations: M myopia (nearsighted); A astigmatism; H hyperopia (farsighted); P presbyopia; Mrx spectacle prescription;  CTL contact lenses; OD right eye; OS left eye; OU both eyes  XT exotropia; ET esotropia; PEK punctate epithelial keratitis; PEE punctate epithelial erosions; DES dry eye syndrome; MGD meibomian gland dysfunction; ATs artificial tears; PFAT's preservative free artificial tears; NSC nuclear sclerotic cataract; PSC posterior subcapsular cataract; ERM epi-retinal membrane; PVD posterior vitreous detachment; RD retinal detachment; DM diabetes mellitus; DR diabetic retinopathy; NPDR non-proliferative diabetic retinopathy; PDR proliferative diabetic retinopathy; CSME clinically significant macular edema; DME diabetic macular edema; dbh dot blot hemorrhages; CWS cotton wool  spot; POAG primary open angle glaucoma; C/D cup-to-disc ratio; HVF humphrey visual field; GVF goldmann visual field; OCT optical coherence tomography; IOP intraocular pressure; BRVO Branch retinal vein occlusion; CRVO central retinal vein occlusion; CRAO central retinal artery occlusion; BRAO branch retinal artery occlusion; RT retinal tear; SB scleral buckle; PPV pars plana vitrectomy; VH Vitreous hemorrhage; PRP panretinal laser photocoagulation; IVK intravitreal kenalog; VMT vitreomacular traction; MH Macular  hole;  NVD neovascularization of the disc; NVE neovascularization elsewhere; AREDS age related eye disease study; ARMD age related macular degeneration; POAG primary open angle glaucoma; EBMD epithelial/anterior basement membrane dystrophy; ACIOL anterior chamber intraocular lens; IOL intraocular lens; PCIOL posterior chamber intraocular lens; Phaco/IOL phacoemulsification with intraocular lens placement; PRK photorefractive keratectomy; LASIK laser assisted in situ keratomileusis; HTN hypertension; DM diabetes mellitus; COPD chronic obstructive pulmonary disease

## 2023-06-25 ENCOUNTER — Ambulatory Visit (INDEPENDENT_AMBULATORY_CARE_PROVIDER_SITE_OTHER): Admitting: Ophthalmology

## 2023-06-25 ENCOUNTER — Encounter (INDEPENDENT_AMBULATORY_CARE_PROVIDER_SITE_OTHER): Payer: Self-pay | Admitting: Ophthalmology

## 2023-06-25 DIAGNOSIS — H33303 Unspecified retinal break, bilateral: Secondary | ICD-10-CM

## 2023-06-25 DIAGNOSIS — H35413 Lattice degeneration of retina, bilateral: Secondary | ICD-10-CM

## 2023-06-25 DIAGNOSIS — H33301 Unspecified retinal break, right eye: Secondary | ICD-10-CM

## 2023-06-25 MED ORDER — PREDNISOLONE ACETATE 1 % OP SUSP: 1.0000 [drp] | Freq: Four times a day (QID) | OPHTHALMIC | 0 refills | Status: AC

## 2023-07-07 NOTE — Progress Notes (Shared)
 [facility] - Clinic Note  07/16/2023     CHIEF COMPLAINT Patient presents for No chief complaint on file.   HISTORY OF PRESENT ILLNESS: Frank Lambert is a 23 y.o. male who presents to the clinic today for:    Patient here for laser retinopexy OS  Referring physician: Claudene Crystal, PA-C [address] [place],  [place] [address]  HISTORICAL INFORMATION:   Selected notes from the MEDICAL RECORD NUMBER Referred by Dr. Merlyn Starring:  Ocular Hx- PMH-    CURRENT MEDICATIONS: No current outpatient medications on file. (Ophthalmic Drugs)   No current facility-administered medications for this visit. (Ophthalmic Drugs)   Current Outpatient Medications (Other)  Medication Sig   omeprazole (PRILOSEC) 20 MG capsule Take 20 mg by mouth daily. (Patient not taking: Reported on 05/07/2023)   No current facility-administered medications for this visit. (Other)   REVIEW OF SYSTEMS:    ALLERGIES No Known Allergies  PAST MEDICAL HISTORY Past Medical History:  Diagnosis Date   ADHD (attention deficit hyperactivity disorder), inattentive type    per [facility] records   GERD (gastroesophageal reflux disease)    Lactose intolerance    Seasonal allergies    Past Surgical History:  Procedure Laterality Date   MYRINGOTOMY     tubes in ears     WISDOM TOOTH EXTRACTION     FAMILY HISTORY Family History  Problem Relation Age of Onset   GER disease Mother    GER disease Father    Cancer Maternal Grandmother        breast   High blood pressure Maternal Grandfather    Cancer Paternal Grandmother        pancreatic   Heart disease Neg Hx    Stroke Neg Hx    SOCIAL HISTORY Social History   Tobacco Use   Smoking status: Never   Smokeless tobacco: Never   Tobacco comments:    No smokers at home  Vaping Use   Vaping status: Never Used  Substance Use Topics   Alcohol use: Yes    Comment: special occasion   Drug use: No       OPHTHALMIC EXAM:  Not  recorded     IMAGING AND PROCEDURES  Imaging and Procedures for 07/16/2023          ASSESSMENT/PLAN:  No diagnosis found.   Retinal break, OD.   - pigmented VR tuft /break 1030 - s/p laser retinopexy OD 12.19.24 -- good laser surrounding - no new RT/RD or lattice - f/u 1 yr -  DFE, OCT, dilate OU  1,2. Lattice degeneration w/ atrophic holes, OU - peripheral lattice w/ microholes in temporal periphery OU - s/p laser retinopexy OD 12.19.24 as above - s/p laser retinopexy OS 5.28.25 - RBA of procedure discussed, questions answered - informed consent obtained and signed - see procedure note - f/u 2-3 weeks, DFE, OCT   Ophthalmic Meds Ordered this visit:  No orders of the defined types were placed in this encounter.    No follow-ups on file.  There are no Patient Instructions on file for this visit.  This document serves as a record of services personally performed by Jeanice Millard, MD, PhD. It was created on their behalf by Angelia Kelp, an ophthalmic technician. The creation of this record is the provider's dictation and/or activities during the visit.    Electronically signed by: Angelia Kelp, OA, 07/07/23  3:05 PM    Jeanice Millard, M.D., Ph.D. Diseases & Surgery of  the Retina and Vitreous [facility] 06/25/2023    Abbreviations: M myopia (nearsighted); A astigmatism; H hyperopia (farsighted); P presbyopia; Mrx spectacle prescription;  CTL contact lenses; OD right eye; OS left eye; OU both eyes  XT exotropia; ET esotropia; PEK punctate epithelial keratitis; PEE punctate epithelial erosions; DES dry eye syndrome; MGD meibomian gland dysfunction; ATs artificial tears; PFAT's preservative free artificial tears; NSC nuclear sclerotic cataract; PSC posterior subcapsular cataract; ERM epi-retinal membrane; PVD posterior vitreous detachment; RD retinal detachment; DM diabetes mellitus; DR diabetic retinopathy; NPDR non-proliferative  diabetic retinopathy; PDR proliferative diabetic retinopathy; CSME clinically significant macular edema; DME diabetic macular edema; dbh dot blot hemorrhages; CWS cotton wool spot; POAG primary open angle glaucoma; C/D cup-to-disc ratio; HVF humphrey visual field; GVF goldmann visual field; OCT optical coherence tomography; IOP intraocular pressure; BRVO Branch retinal vein occlusion; CRVO central retinal vein occlusion; CRAO central retinal artery occlusion; BRAO branch retinal artery occlusion; RT retinal tear; SB scleral buckle; PPV pars plana vitrectomy; VH Vitreous hemorrhage; PRP panretinal laser photocoagulation; IVK intravitreal kenalog; VMT vitreomacular traction; MH Macular hole;  NVD neovascularization of the disc; NVE neovascularization elsewhere; AREDS age related eye disease study; ARMD age related macular degeneration; POAG primary open angle glaucoma; EBMD epithelial/anterior basement membrane dystrophy; ACIOL anterior chamber intraocular lens; IOL intraocular lens; PCIOL posterior chamber intraocular lens; Phaco/IOL phacoemulsification with intraocular lens placement; PRK photorefractive keratectomy; LASIK laser assisted in situ keratomileusis; HTN hypertension; DM diabetes mellitus; COPD chronic obstructive pulmonary disease

## 2023-07-16 ENCOUNTER — Encounter (INDEPENDENT_AMBULATORY_CARE_PROVIDER_SITE_OTHER): Admitting: Ophthalmology

## 2023-07-16 DIAGNOSIS — H35413 Lattice degeneration of retina, bilateral: Secondary | ICD-10-CM

## 2023-07-16 DIAGNOSIS — H33303 Unspecified retinal break, bilateral: Secondary | ICD-10-CM

## 2023-07-17 NOTE — Progress Notes (Signed)
 [facility] - Clinic Note  07/22/2023     CHIEF COMPLAINT Patient presents for Retina Follow Up   HISTORY OF PRESENT ILLNESS: Frank Lambert is a 23 y.o. male who presents to the clinic today for:   HPI     Retina Follow Up   In both eyes.  This started 3 weeks ago.  Duration of 3 weeks.  Since onset it is stable.  I, the attending physician,  performed the HPI with the patient and updated documentation appropriately.        Comments   3 week retina follow up lattice deg pt is reporting no vision changes noticed he denies any flashes or floaters pt finished PF       Last edited by Valdemar Rogue, MD on 07/26/2023 12:14 AM.      Referring physician: Bulah Alm RAMAN, PA-C [id number] [place],  [place] [address]  HISTORICAL INFORMATION:   Selected notes from the MEDICAL RECORD NUMBER Referred by Dr. JAMA:  Ocular Hx- PMH-    CURRENT MEDICATIONS: No current outpatient medications on file. (Ophthalmic Drugs)   No current facility-administered medications for this visit. (Ophthalmic Drugs)   Current Outpatient Medications (Other)  Medication Sig   omeprazole (PRILOSEC) 20 MG capsule Take 20 mg by mouth daily. (Patient not taking: Reported on 05/07/2023)   No current facility-administered medications for this visit. (Other)   REVIEW OF SYSTEMS: ROS   Positive for: Gastrointestinal, Eyes, Allergic/Imm Last edited by Resa Delon ORN, COT on 07/22/2023  8:36 AM.       ALLERGIES No Known Allergies  PAST MEDICAL HISTORY Past Medical History:  Diagnosis Date   ADHD (attention deficit hyperactivity disorder), inattentive type    per [facility] records   GERD (gastroesophageal reflux disease)    Lactose intolerance    Seasonal allergies    Past Surgical History:  Procedure Laterality Date   MYRINGOTOMY     tubes in ears     WISDOM TOOTH EXTRACTION     FAMILY HISTORY Family History  Problem Relation Age of Onset   GER disease  Mother    GER disease Father    Cancer Maternal Grandmother        breast   High blood pressure Maternal Grandfather    Cancer Paternal Grandmother        pancreatic   Heart disease Neg Hx    Stroke Neg Hx    SOCIAL HISTORY Social History   Tobacco Use   Smoking status: Never   Smokeless tobacco: Never   Tobacco comments:    No smokers at home  Vaping Use   Vaping status: Never Used  Substance Use Topics   Alcohol use: Yes    Comment: special occasion   Drug use: No       OPHTHALMIC EXAM:  Base Eye Exam     Visual Acuity (Snellen - Linear)       Right Left   Dist [place] 20/20 20/20         Tonometry (Tonopen, 8:40 AM)       Right Left   Pressure 13 16         Pupils       Pupils Dark Light Shape React APD   Right PERRL 3 2 Round Brisk None   Left PERRL 3 2 Round Brisk None         Visual Fields       Left Right    Full Full  Extraocular Movement       Right Left    Full, Ortho Full, Ortho         Neuro/Psych     Oriented x3: Yes   Mood/Affect: Normal         Dilation     Both eyes: 2.5% Phenylephrine @ 8:40 AM           Slit Lamp and Fundus Exam     External Exam       Right Left   External Normal Normal         Slit Lamp Exam       Right Left   Lids/Lashes Normal Normal   Conjunctiva/Sclera Mild Melanosis Mild Melanosis   Cornea Clear Clear   Anterior Chamber Deep and clear Deep and clear   Iris Round and dilated Round and dilated   Lens Clear Clear   Anterior Vitreous Normal Normal         Fundus Exam       Right Left   Disc Pink and sharp Pink and sharp, mild PPP   C/D Ratio 0.3 0.2   Macula Flat, Good foveal reflex, No heme or edema Flat, Good foveal reflex, No heme or edema   Vessels Normal Normal   Periphery Attached, mild White without pressure temporally, pigment VR tuft /break at 1030-- good laser surrounding, mild peripheral lattice temporally -- good peripheral laser from 0600-1100  Attached, White without pressure temporally, mild peripheral lattice with atrophic micro holes from 0300-0600 -- good laser changes from 0130-0600           Refraction     Wearing Rx       Sphere Cylinder Axis   Right -3.75 +0.50 077   Left -3.00 +0.75 109            IMAGING AND PROCEDURES  Imaging and Procedures for 07/22/2023  OCT, Retina - OU - Both Eyes       Right Eye Quality was good. Central Foveal Thickness: 246. Progression has been stable. Findings include normal foveal contour, no IRF, no SRF, vitreomacular adhesion .   Left Eye Quality was good. Central Foveal Thickness: 246. Progression has been stable. Findings include normal foveal contour, no IRF, no SRF, vitreomacular adhesion .   Notes *Images captured and stored on drive  Diagnosis / Impression:  NFP; no IRF/SRF OU  Clinical management:  See below  Abbreviations: NFP - Normal foveal profile. CME - cystoid macular edema. PED - pigment epithelial detachment. IRF - intraretinal fluid. SRF - subretinal fluid. EZ - ellipsoid zone. ERM - epiretinal membrane. ORA - outer retinal atrophy. ORT - outer retinal tubulation. SRHM - subretinal hyper-reflective material. IRHM - intraretinal hyper-reflective material             ASSESSMENT/PLAN:    ICD-10-CM   1. Retinal break of both eyes  H33.303 OCT, Retina - OU - Both Eyes    2. Lattice degeneration of peripheral retina, bilateral  H35.413      Retinal break, OD.   - pigmented VR tuft /break 1030 - s/p laser retinopexy OD 12.19.24 -- good laser surrounding - no new RT/RD or lattice  1,2. Lattice degeneration w/ atrophic holes, OU - peripheral lattice w/ microholes in temporal periphery OU - s/p laser retinopexy OD 12.19.24 as above - s/p laser retinopexy OS 5.28.25 -- good laser changes in place - no new RT/RD or lattice OU - f/u 3 months, DFE, OCT   Ophthalmic Meds Ordered this  visit:  No orders of the defined types were placed in this  encounter.    Return in about 3 months (around 10/22/2023) for f/u lattice degeneration OU, DFE, OCT.  There are no Patient Instructions on file for this visit.  This document serves as a record of services personally performed by Redell JUDITHANN Hans, MD, PhD. It was created on their behalf by Alan PARAS. Delores, OA an ophthalmic technician. The creation of this record is the provider's dictation and/or activities during the visit.    Electronically signed by: Alan PARAS. Delores, OA 07/26/23 12:15 AM  Redell JUDITHANN Hans, M.D., Ph.D. Diseases & Surgery of the Retina and Vitreous [facility] 07/22/2023   I have reviewed the above documentation for accuracy and completeness, and I agree with the above. Redell JUDITHANN Hans, M.D., Ph.D. 07/26/23 12:15 AM   Abbreviations: M myopia (nearsighted); A astigmatism; H hyperopia (farsighted); P presbyopia; Mrx spectacle prescription;  CTL contact lenses; OD right eye; OS left eye; OU both eyes  XT exotropia; ET esotropia; PEK punctate epithelial keratitis; PEE punctate epithelial erosions; DES dry eye syndrome; MGD meibomian gland dysfunction; ATs artificial tears; PFAT's preservative free artificial tears; NSC nuclear sclerotic cataract; PSC posterior subcapsular cataract; ERM epi-retinal membrane; PVD posterior vitreous detachment; RD retinal detachment; DM diabetes mellitus; DR diabetic retinopathy; NPDR non-proliferative diabetic retinopathy; PDR proliferative diabetic retinopathy; CSME clinically significant macular edema; DME diabetic macular edema; dbh dot blot hemorrhages; CWS cotton wool spot; POAG primary open angle glaucoma; C/D cup-to-disc ratio; HVF humphrey visual field; GVF goldmann visual field; OCT optical coherence tomography; IOP intraocular pressure; BRVO Branch retinal vein occlusion; CRVO central retinal vein occlusion; CRAO central retinal artery occlusion; BRAO branch retinal artery occlusion; RT retinal tear; SB scleral buckle;  PPV pars plana vitrectomy; VH Vitreous hemorrhage; PRP panretinal laser photocoagulation; IVK intravitreal kenalog; VMT vitreomacular traction; MH Macular hole;  NVD neovascularization of the disc; NVE neovascularization elsewhere; AREDS age related eye disease study; ARMD age related macular degeneration; POAG primary open angle glaucoma; EBMD epithelial/anterior basement membrane dystrophy; ACIOL anterior chamber intraocular lens; IOL intraocular lens; PCIOL posterior chamber intraocular lens; Phaco/IOL phacoemulsification with intraocular lens placement; PRK photorefractive keratectomy; LASIK laser assisted in situ keratomileusis; HTN hypertension; DM diabetes mellitus; COPD chronic obstructive pulmonary disease

## 2023-07-22 ENCOUNTER — Ambulatory Visit (INDEPENDENT_AMBULATORY_CARE_PROVIDER_SITE_OTHER): Admitting: Ophthalmology

## 2023-07-22 ENCOUNTER — Encounter (INDEPENDENT_AMBULATORY_CARE_PROVIDER_SITE_OTHER): Payer: Self-pay | Admitting: Ophthalmology

## 2023-07-22 DIAGNOSIS — H33301 Unspecified retinal break, right eye: Secondary | ICD-10-CM

## 2023-07-22 DIAGNOSIS — H35413 Lattice degeneration of retina, bilateral: Secondary | ICD-10-CM

## 2023-07-22 DIAGNOSIS — H33303 Unspecified retinal break, bilateral: Secondary | ICD-10-CM | POA: Diagnosis not present

## 2023-07-26 ENCOUNTER — Encounter (INDEPENDENT_AMBULATORY_CARE_PROVIDER_SITE_OTHER): Payer: Self-pay | Admitting: Ophthalmology

## 2023-08-09 ENCOUNTER — Other Ambulatory Visit: Payer: Self-pay

## 2023-08-09 ENCOUNTER — Emergency Department (HOSPITAL_COMMUNITY)
Admission: EM | Admit: 2023-08-09 | Discharge: 2023-08-09 | Disposition: A | Discharge status: Home / Self Care | Source: Ambulatory Visit | Attending: Emergency Medicine | Admitting: Emergency Medicine

## 2023-08-09 ENCOUNTER — Encounter (HOSPITAL_COMMUNITY): Payer: Self-pay

## 2023-08-09 DIAGNOSIS — L03213 Periorbital cellulitis: Secondary | ICD-10-CM | POA: Insufficient documentation

## 2023-08-09 DIAGNOSIS — L0201 Cutaneous abscess of face: Secondary | ICD-10-CM | POA: Diagnosis not present

## 2023-08-09 DIAGNOSIS — L0291 Cutaneous abscess, unspecified: Secondary | ICD-10-CM

## 2023-08-09 MED ORDER — CLINDAMYCIN HCL 300 MG PO CAPS: 300.0000 mg | ORAL_CAPSULE | Freq: Three times a day (TID) | ORAL | 0 refills | Status: DC

## 2023-08-09 NOTE — ED Triage Notes (Signed)
 Pt arrived reporting abscess under left eye for about 1 week. Reports this morning it popped. States there was a lot of pressure to the area prior to it draining. Denies pain or pressure around the eye. States mild tenderness to the exact spot. No changes in vision. No fevers or any other symptoms reports. [facility] provider this morning and recommended ED

## 2023-08-09 NOTE — Progress Notes (Signed)
 Virtual Visit  Subjective: Subjective Patient ID: Frank Lambert is a 23 y.o. male.  HPI  Patient reports developing a cyst under his left eye. He thought it was a pimple at first. This morning, he reports the lesion popped and purulent drainage returned. He states after the lesion popped, the swelling increased.  He reports having retina repair surgery in May but denies any complications since then. He denies any vision changes or eye pain minus the pain near the lesion.   Skin complaint  Duration of current symptoms:  3 days Onset quality:  Suddenly Associated symptoms: eye problems, lesion, pain and purulent discharge   Associated symptoms: no bleeding, no myalgias, no chills, no cough, no appetite change, no diarrhea, no fatigue, no fever, no headaches, no itching, no malaise, no nail changes, no congestion, no nausea, no neck pain, no rhinorrhea, no sore throat, no diaphoresis, no swollen glands, no paraparesis and no vomiting   Treatments tried:  None (Differin) Response to treatment:  No change Special considerations:  None apply Location skin: face   Characteristics skin: pain, redness and tenderness    Review of Systems  Constitutional:  Negative for appetite change, chills, diaphoresis, fatigue and fever.  HENT:  Negative for congestion, rhinorrhea and sore throat.   Eyes:  Positive for redness. Negative for photophobia, pain, discharge and itching.  Respiratory:  Negative for cough.   Gastrointestinal:  Negative for diarrhea, nausea and vomiting.  Musculoskeletal:  Negative for myalgias and neck pain.  Skin:  Negative for itching and nail changes.  Neurological:  Negative for headaches.   Social History   Tobacco Use  Smoking Status Never  Smokeless Tobacco Never   Past Medical History:  Diagnosis Date  . Retina disorder, bilateral    Past Surgical History:  Procedure Laterality Date  . REFRACTIVE SURGERY    . RETINAL LASER PROCEDURE Bilateral   . WISDOM TOOTH  EXTRACTION     No family history on file.  Objective: Objective Physical Exam Constitutional:      General: He is not in acute distress.    Appearance: He is well-developed. He is not diaphoretic.  HENT:     Head: Normocephalic and atraumatic. Left periorbital erythema present.   Eyes:   Cardiovascular:     Rate and Rhythm: Normal rate.  Pulmonary:     Effort: Pulmonary effort is normal. No respiratory distress.  Musculoskeletal:     Cervical back: Normal range of motion and neck supple.  Skin:    General: Skin is warm and dry.  Neurological:     Mental Status: He is alert and oriented to person, place, and time.  Psychiatric:        Behavior: Behavior is cooperative.       Assessment/Plan: Assessment HPI provided by Self  Based on today's visit:history and physical exam only, as no relevant testing deemed necessary patient's visit diagnosis is/includes  1. Preseptal cellulitis of left lower eyelid    Patient does not have a history of chronic conditions.   Plan Treatment plan includes:  Orders Placed: No orders of the defined types were placed in this encounter.  Medications ordered this visit    No prescriptions requested or ordered in this encounter    Current medication list and any new medications prescribed or recommended today were reviewed with the patient and specific instructions were provided Yes  Provider Recommendations:   1. Preseptal cellulitis of left lower eyelid (Primary)  Advised patient to go to ER for  further evaluation and treatment. Pt agrees with plan. Pt requested I speak to his mother, Frank Lambert. Spoke with his mother and she will take patient to the ER; [facility]: Gave report to Charge Nurse Ole, RN.  - ED Referral Documentation    Follow up care instructions were provided and reviewed?with the  Patient. All questions were answered. Patient verbalized understanding of plan of care today.                                      I am having Frank Lambert maintain his ascorbic acid (vitamin C).  I have verified the patient's location, and I am licensed to practice in that state. Audio and video technology were used to conduct this virtual visit. Patient (or parent/guardian as applicable) consented to virtual care.  Patient is: not a minor

## 2023-08-09 NOTE — Discharge Instructions (Addendum)
 Please take the entire course of antibiotics that I prescribed, please return to the emergency department or see your ophthalmologist if you are concerned for worsening symptoms despite treatment.  Worsening symptoms would include pain with eye movements, extension of redness, swelling, increasing pain, swelling affecting your vision.

## 2023-08-09 NOTE — ED Provider Notes (Signed)
 [facility] EMERGENCY DEPARTMENT AT [facility] Provider Note   CSN: [id number] Arrival date & time: 08/09/23  1033     Patient presents with: Eye Problem and Abscess   Frank Lambert is a 23 y.o. male with overall noncontributory past medical history presents concern for abscess/cellulitis under the left eye for around 1 week.  He reports this morning it popped, some change of white pus.  There was a lot of pressure in the area prior to draining.  He saw urgent care on video visit and they recommended he come to the ED.    Eye Problem Abscess      Prior to Admission medications   Medication Sig Start Date End Date Taking? Authorizing Provider  clindamycin  (CLEOCIN ) 300 MG capsule Take 1 capsule (300 mg total) by mouth 3 (three) times daily. 08/09/23  Yes Ayomide Purdy H, PA-C  omeprazole (PRILOSEC) 20 MG capsule Take 20 mg by mouth daily. Patient not taking: Reported on 05/07/2023 08/07/22 08/07/23  [provider]    Allergies: Patient has no known allergies.    Review of Systems  All other systems reviewed and are negative.   Updated Vital Signs BP 129/81 (BP Location: Right Arm)   Pulse 80   Temp 98.2 F (36.8 C) (Oral)   Resp 18   Ht 5' 5 (1.651 m)   Wt 68 kg   SpO2 99%   BMI 24.96 kg/m   Physical Exam Vitals and nursing note reviewed.  Constitutional:      General: He is not in acute distress.    Appearance: Normal appearance.  HENT:     Head: Normocephalic and atraumatic.  Eyes:     General:        Right eye: No discharge.        Left eye: No discharge.     Comments: Patient with some periorbital cellulitis, no proptosis, no pain with extraocular movements.  There is a small area that appears to be a pimple/small developing abscess which appears appropriately drained, no fluctuance or fluid noted on exam.  Cardiovascular:     Rate and Rhythm: Normal rate and regular rhythm.  Pulmonary:     Effort: Pulmonary effort is normal. No  respiratory distress.  Musculoskeletal:        General: No deformity.  Skin:    General: Skin is warm and dry.  Neurological:     Mental Status: He is alert and oriented to person, place, and time.  Psychiatric:        Mood and Affect: Mood normal.        Behavior: Behavior normal.       (all labs ordered are listed, but only abnormal results are displayed) Labs Reviewed - No data to display  EKG: None  Radiology: No results found.   Procedures   Medications Ordered in the ED - No data to display                                  Medical Decision Making  This patient is a 23 y.o. male who presents to the ED for concern of Infection under eye in preseptal space.   Differential diagnoses prior to evaluation: periorbital cellulitis, orbital cellulitis, abscess, vs other  Past Medical History / Social History / Additional history: Chart reviewed. Pertinent results include: overall noncontributory  Physical Exam: Physical exam performed. The pertinent findings include: Patient with some periorbital  cellulitis, no proptosis, no pain with extraocular movements.  There is a small area that appears to be a pimple/small developing abscess which appears appropriately drained, no fluctuance or fluid noted on exam.   Medications / Treatment: Will treat with antibiotics, encourage ophthalmology or ED return if ongoing concern   Disposition: After consideration of the diagnostic results and the patients response to treatment, I feel that patient is stable for discharge, will discharge with antibiotics, encourage close ED versus ophthalmology follow-up as needed.[name]   emergency department workup does not suggest an emergent condition requiring admission or immediate intervention beyond what has been performed at this time. The plan is: as above. The patient is safe for discharge and has been instructed to return immediately for worsening symptoms, change in symptoms or any other  concerns.   Final diagnoses:  Abscess  Preseptal cellulitis    ED Discharge Orders          Ordered    clindamycin  (CLEOCIN ) 300 MG capsule  3 times daily        08/09/23 1111               Nylia Gavina, Sherlean VEAR RIGGERS 08/09/23 1111    Laurice Maude BROCKS, MD 08/09/23 [id number]

## 2023-08-12 ENCOUNTER — Encounter: Payer: 59 | Admitting: Medical

## 2023-09-05 ENCOUNTER — Ambulatory Visit: Admitting: Medical

## 2023-09-05 VITALS — BP 110/68 | HR 69 | Wt 147.0 lb

## 2023-09-05 DIAGNOSIS — L0201 Cutaneous abscess of face: Secondary | ICD-10-CM | POA: Diagnosis not present

## 2023-09-05 DIAGNOSIS — T148XXA Other injury of unspecified body region, initial encounter: Secondary | ICD-10-CM

## 2023-09-05 MED ORDER — CHLORHEXIDINE GLUCONATE 4 % EX SOLN: CUTANEOUS | 2 refills | Status: AC

## 2023-09-05 NOTE — Progress Notes (Signed)
 Subjective:  Frank Lambert is a 23 y.o. male who presents for Chief Complaint  Patient presents with   Acute Visit    Has a scar under eye and would like to know what he can do to get rid of the scar, in the future is there any recommendations for antibacterial soaps      Here for emergency department follow-up.  He was seen on 08/09/2023 for redness and possible abscess under the left eye.  The day he went to the emergency department pus and drained out.[name]  He was put on clindamycin  antibiotic.  At this point he has finished clindamycin .  He is concerned there is a scar left behind from abscess.     He notes hx/o boil on knee long time ago, but no hx/o recurrent abscess.  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  Over the course of the week it started getting red and swollen and turned into a pimple-like lesion.  By the time he went to the emergency department and it started to have pus draining.  At this point he is using some topical antibacterial ointment.  He has questions about soaks and antibacterial treatment  No other aggravating or relieving factors.    No other c/o.  Past Medical History:  Diagnosis Date   ADHD (attention deficit hyperactivity disorder), inattentive type    per [facility] records   GERD (gastroesophageal reflux disease)    Lactose intolerance    Seasonal allergies    No current outpatient medications on file prior to visit.   No current facility-administered medications on file prior to visit.     The following portions of the patient's history were reviewed and updated as appropriate: allergies, current medications, past family history, past medical history, past social history, past surgical history and problem list.  ROS Otherwise as in subjective above  Objective: BP 110/68   Pulse 69   Wt 147 lb (66.7 kg)   BMI 24.46 kg/m   General appearance: alert, no distress, well developed, well nourished Skin: Left face  inferior to the eye with a 5 mm x 3 mm somewhat nodular texture from where there was an abscess in open skin 2 to 3 weeks prior.  No current erythema, fluctuance, warmth or induration   Assessment: Encounter Diagnoses  Name Primary?   Wound of skin Yes   Cutaneous abscess of face      Plan: At this point the abscess has resolved but there is some leftover skin remodeling.  We discussed that this may heal up on its own.  I reviewed the emergency department notes from 08/09/2023 visit.  Nevertheless because it is on the face in such a prominent area and he has somewhat of a harder texture he is worried about scarring.   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  If you have not heard back within a week about this referral then let me know  Frank Lambert was seen today for acute visit.  Diagnoses and all orders for this visit:  Wound of skin -     Ambulatory referral to Plastic Surgery  Cutaneous abscess of face -     Ambulatory referral to Plastic Surgery    Follow up: With plastic surgery consult, f/u as needed otherwise

## 2023-09-05 NOTE — Patient Instructions (Signed)
 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  If you have not heard back within a week about this referral then let me know

## 2023-10-15 NOTE — Progress Notes (Shared)
 [facility] - Clinic Note  10/28/2023     CHIEF COMPLAINT Patient presents for No chief complaint on file.   HISTORY OF PRESENT ILLNESS: Frank Lambert is a 23 y.o. male who presents to the clinic today for:      Referring physician: Bulah Alm RAMAN, PA-C [address] [place],  [place] [address]  HISTORICAL INFORMATION:   Selected notes from the MEDICAL RECORD NUMBER Referred by Dr. JAMA:  Ocular Hx- PMH-    CURRENT MEDICATIONS: No current outpatient medications on file. (Ophthalmic Drugs)   No current facility-administered medications for this visit. (Ophthalmic Drugs)   Current Outpatient Medications (Other)  Medication Sig   chlorhexidine  (HIBICLENS ) 4 % external liquid Apply topically once a week.   No current facility-administered medications for this visit. (Other)   REVIEW OF SYSTEMS:     ALLERGIES No Known Allergies  PAST MEDICAL HISTORY Past Medical History:  Diagnosis Date   ADHD (attention deficit hyperactivity disorder), inattentive type    per [facility] records   GERD (gastroesophageal reflux disease)    Lactose intolerance    Seasonal allergies    Past Surgical History:  Procedure Laterality Date   MYRINGOTOMY     tubes in ears     WISDOM TOOTH EXTRACTION     FAMILY HISTORY Family History  Problem Relation Age of Onset   GER disease Mother    GER disease Father    Cancer Maternal Grandmother        breast   High blood pressure Maternal Grandfather    Cancer Paternal Grandmother        pancreatic   Heart disease Neg Hx    Stroke Neg Hx    SOCIAL HISTORY Social History   Tobacco Use   Smoking status: Never   Smokeless tobacco: Never   Tobacco comments:    No smokers at home  Vaping Use   Vaping status: Never Used  Substance Use Topics   Alcohol use: Yes    Comment: special occasion   Drug use: No       OPHTHALMIC EXAM:  Not recorded     IMAGING AND PROCEDURES  Imaging and Procedures for  10/28/2023           ASSESSMENT/PLAN:    ICD-10-CM   1. Retinal break of both eyes  H33.303     2. 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 Retinal break of right eye  H33.301       Retinal break, OD.   - pigmented VR tuft /break 1030 - s/p laser retinopexy OD 12.19.24 -- good laser surrounding - no new RT/RD or lattice  1,2. Lattice degeneration w/ atrophic holes, OU - peripheral lattice w/ microholes in temporal periphery OU - s/p laser retinopexy OD 12.19.24 as above - s/p laser retinopexy OS 5.28.25 -- good laser changes in place - no new RT/RD or lattice OU - f/u 3 months, DFE, OCT   Ophthalmic Meds Ordered this visit:  No orders of the defined types were placed in this encounter.    No follow-ups on file.  There are no Patient Instructions on file for this visit.  This document serves as a record of services personally performed by Redell JUDITHANN Hans, MD, PhD. It was created on their behalf by Wanda GEANNIE Keens, COT an ophthalmic technician. The creation of this record is the provider's dictation and/or activities during the visit.    Electronically signed by:  Wanda GEANNIE Keens, COT  10/15/23 7:11 AM   Redell JUDITHANN Hans, M.D., Ph.D. Diseases & Surgery of the Retina and Vitreous [facility] 07/22/2023     Abbreviations: M myopia (nearsighted); A astigmatism; H hyperopia (farsighted); P presbyopia; Mrx spectacle prescription;  CTL contact lenses; OD right eye; OS left eye; OU both eyes  XT exotropia; ET esotropia; PEK punctate epithelial keratitis; PEE punctate epithelial erosions; DES dry eye syndrome; MGD meibomian gland dysfunction; ATs artificial tears; PFAT's preservative free artificial tears; NSC nuclear sclerotic cataract; PSC posterior subcapsular cataract; ERM epi-retinal membrane; PVD posterior vitreous detachment; RD retinal detachment; DM diabetes mellitus; DR diabetic retinopathy; NPDR  non-proliferative diabetic retinopathy; PDR proliferative diabetic retinopathy; CSME clinically significant macular edema; DME diabetic macular edema; dbh dot blot hemorrhages; CWS cotton wool spot; POAG primary open angle glaucoma; C/D cup-to-disc ratio; HVF humphrey visual field; GVF goldmann visual field; OCT optical coherence tomography; IOP intraocular pressure; BRVO Branch retinal vein occlusion; CRVO central retinal vein occlusion; CRAO central retinal artery occlusion; BRAO branch retinal artery occlusion; RT retinal tear; SB scleral buckle; PPV pars plana vitrectomy; VH Vitreous hemorrhage; PRP panretinal laser photocoagulation; IVK intravitreal kenalog; VMT vitreomacular traction; MH Macular hole;  NVD neovascularization of the disc; NVE neovascularization elsewhere; AREDS age related eye disease study; ARMD age related macular degeneration; POAG primary open angle glaucoma; EBMD epithelial/anterior basement membrane dystrophy; ACIOL anterior chamber intraocular lens; IOL intraocular lens; PCIOL posterior chamber intraocular lens; Phaco/IOL phacoemulsification with intraocular lens placement; PRK photorefractive keratectomy; LASIK laser assisted in situ keratomileusis; HTN hypertension; DM diabetes mellitus; COPD chronic obstructive pulmonary disease

## 2023-10-28 ENCOUNTER — Encounter (INDEPENDENT_AMBULATORY_CARE_PROVIDER_SITE_OTHER): Admitting: Ophthalmology

## 2023-10-28 DIAGNOSIS — H35413 Lattice degeneration of retina, bilateral: Secondary | ICD-10-CM

## 2023-10-28 DIAGNOSIS — H33301 Unspecified retinal break, right eye: Secondary | ICD-10-CM

## 2023-10-28 DIAGNOSIS — H33303 Unspecified retinal break, bilateral: Secondary | ICD-10-CM

## 2024-08-24 ENCOUNTER — Encounter: Payer: Self-pay | Admitting: Medical
